# Patient Record
Sex: Male | Born: 1970 | Race: White | Hispanic: No | Smoking: Current every day smoker
Health system: Southern US, Community
[De-identification: ages and names within clinical notes are randomized; demographics above are authoritative.]

## PROBLEM LIST (undated history)

## (undated) DIAGNOSIS — I10 Essential (primary) hypertension: Secondary | ICD-10-CM

## (undated) DIAGNOSIS — IMO0001 Reserved for inherently not codable concepts without codable children: Secondary | ICD-10-CM

## (undated) DIAGNOSIS — M1990 Unspecified osteoarthritis, unspecified site: Secondary | ICD-10-CM

## (undated) HISTORY — PX: BACK SURGERY: SHX140

## (undated) HISTORY — PX: APPENDECTOMY: SHX54

## (undated) HISTORY — PX: ACETABULUM FRACTURE SURGERY: SHX541

## (undated) HISTORY — PX: FEMUR SURGERY: SHX943

---

## 2003-11-12 ENCOUNTER — Emergency Department (HOSPITAL_COMMUNITY): Admission: EM | Admit: 2003-11-12 | Discharge: 2003-11-12 | Payer: Self-pay | Admitting: Emergency Medicine

## 2005-07-04 ENCOUNTER — Emergency Department (HOSPITAL_COMMUNITY): Admission: EM | Admit: 2005-07-04 | Discharge: 2005-07-04 | Payer: Self-pay | Admitting: Emergency Medicine

## 2005-07-06 ENCOUNTER — Ambulatory Visit: Payer: Self-pay | Admitting: Orthopedic Surgery

## 2007-01-29 ENCOUNTER — Emergency Department (HOSPITAL_COMMUNITY): Admission: EM | Admit: 2007-01-29 | Discharge: 2007-01-29 | Payer: Self-pay | Admitting: Emergency Medicine

## 2007-01-31 ENCOUNTER — Ambulatory Visit (HOSPITAL_COMMUNITY): Admission: RE | Admit: 2007-01-31 | Discharge: 2007-02-01 | Payer: Self-pay | Admitting: Family Medicine

## 2007-08-17 ENCOUNTER — Ambulatory Visit (HOSPITAL_COMMUNITY): Admission: RE | Admit: 2007-08-17 | Discharge: 2007-08-18 | Payer: Self-pay | Admitting: Orthopaedic Surgery

## 2008-01-11 ENCOUNTER — Emergency Department (HOSPITAL_COMMUNITY): Admission: EM | Admit: 2008-01-11 | Discharge: 2008-01-11 | Payer: Self-pay | Admitting: Emergency Medicine

## 2008-01-14 ENCOUNTER — Emergency Department (HOSPITAL_COMMUNITY): Admission: EM | Admit: 2008-01-14 | Discharge: 2008-01-14 | Payer: Self-pay | Admitting: Emergency Medicine

## 2009-01-13 ENCOUNTER — Encounter: Payer: Self-pay | Admitting: Emergency Medicine

## 2009-01-13 ENCOUNTER — Inpatient Hospital Stay (HOSPITAL_COMMUNITY): Admission: EM | Admit: 2009-01-13 | Discharge: 2009-01-17 | Payer: Self-pay | Admitting: Orthopaedic Surgery

## 2009-03-11 ENCOUNTER — Encounter (HOSPITAL_COMMUNITY): Admission: RE | Admit: 2009-03-11 | Discharge: 2009-04-10 | Payer: Self-pay | Admitting: Orthopedic Surgery

## 2009-04-11 ENCOUNTER — Encounter (HOSPITAL_COMMUNITY): Admission: RE | Admit: 2009-04-11 | Discharge: 2009-04-19 | Payer: Self-pay | Admitting: Orthopedic Surgery

## 2009-04-23 ENCOUNTER — Encounter (HOSPITAL_COMMUNITY): Admission: RE | Admit: 2009-04-23 | Discharge: 2009-05-23 | Payer: Self-pay | Admitting: Orthopedic Surgery

## 2009-05-08 ENCOUNTER — Encounter (HOSPITAL_COMMUNITY): Admission: RE | Admit: 2009-05-08 | Discharge: 2009-06-07 | Payer: Self-pay | Admitting: Orthopedic Surgery

## 2009-05-24 ENCOUNTER — Encounter (HOSPITAL_COMMUNITY): Admission: RE | Admit: 2009-05-24 | Discharge: 2009-06-23 | Payer: Self-pay | Admitting: Orthopedic Surgery

## 2009-06-29 ENCOUNTER — Observation Stay (HOSPITAL_COMMUNITY): Admission: EM | Admit: 2009-06-29 | Discharge: 2009-06-29 | Payer: Self-pay | Admitting: Emergency Medicine

## 2009-10-01 ENCOUNTER — Encounter: Admission: RE | Admit: 2009-10-01 | Discharge: 2009-10-01 | Payer: Self-pay | Admitting: Orthopedic Surgery

## 2010-07-14 LAB — DIFFERENTIAL
Basophils Absolute: 0.1 10*3/uL (ref 0.0–0.1)
Eosinophils Relative: 2 % (ref 0–5)
Lymphocytes Relative: 19 % (ref 12–46)
Lymphs Abs: 1.5 10*3/uL (ref 0.7–4.0)
Neutro Abs: 5.4 10*3/uL (ref 1.7–7.7)

## 2010-07-14 LAB — CBC
HCT: 44.7 % (ref 39.0–52.0)
MCHC: 35 g/dL (ref 30.0–36.0)
MCV: 90 fL (ref 78.0–100.0)
Platelets: 186 10*3/uL (ref 150–400)
RBC: 4.97 MIL/uL (ref 4.22–5.81)

## 2010-07-14 LAB — URINE CULTURE: Colony Count: NO GROWTH

## 2010-07-14 LAB — COMPREHENSIVE METABOLIC PANEL
AST: 22 U/L (ref 0–37)
BUN: 5 mg/dL — ABNORMAL LOW (ref 6–23)
CO2: 29 mEq/L (ref 19–32)
Calcium: 9.2 mg/dL (ref 8.4–10.5)
Creatinine, Ser: 0.8 mg/dL (ref 0.4–1.5)
GFR calc Af Amer: >60 mL/min (ref >60)
GFR calc non Af Amer: >60 mL/min (ref >60)

## 2010-07-14 LAB — URINE MICROSCOPIC-ADD ON

## 2010-07-14 LAB — URINALYSIS, ROUTINE W REFLEX MICROSCOPIC
Bilirubin Urine: NEGATIVE
Glucose, UA: NEGATIVE mg/dL
Ketones, ur: NEGATIVE mg/dL
pH: 7 (ref 5.0–8.0)

## 2010-07-25 LAB — BASIC METABOLIC PANEL
BUN: 3 mg/dL — ABNORMAL LOW (ref 6–23)
BUN: 3 mg/dL — ABNORMAL LOW (ref 6–23)
BUN: 3 mg/dL — ABNORMAL LOW (ref 6–23)
BUN: 3 mg/dL — ABNORMAL LOW (ref 6–23)
CO2: 29 mEq/L (ref 19–32)
CO2: 29 mEq/L (ref 19–32)
Calcium: 7.8 mg/dL — ABNORMAL LOW (ref 8.4–10.5)
Calcium: 7.9 mg/dL — ABNORMAL LOW (ref 8.4–10.5)
Calcium: 7.9 mg/dL — ABNORMAL LOW (ref 8.4–10.5)
Calcium: 8.6 mg/dL (ref 8.4–10.5)
Chloride: 97 mEq/L (ref 96–112)
Chloride: 102 mEq/L (ref 96–112)
Creatinine, Ser: 0.75 mg/dL (ref 0.4–1.5)
Creatinine, Ser: 0.8 mg/dL (ref 0.4–1.5)
Creatinine, Ser: 0.76 mg/dL (ref 0.4–1.5)
GFR calc Af Amer: >60 mL/min (ref >60)
GFR calc non Af Amer: >60 mL/min (ref >60)
GFR calc non Af Amer: >60 mL/min (ref >60)
GFR calc non Af Amer: >60 mL/min (ref >60)
GFR calc non Af Amer: >60 mL/min (ref >60)
Glucose, Bld: 104 mg/dL — ABNORMAL HIGH (ref 70–99)
Glucose, Bld: 116 mg/dL — ABNORMAL HIGH (ref 70–99)
Glucose, Bld: 149 mg/dL — ABNORMAL HIGH (ref 70–99)
Potassium: 3.8 mEq/L (ref 3.5–5.1)
Sodium: 133 mEq/L — ABNORMAL LOW (ref 135–145)
Sodium: 134 mEq/L — ABNORMAL LOW (ref 135–145)
Sodium: 137 mEq/L (ref 135–145)

## 2010-07-25 LAB — CROSSMATCH

## 2010-07-25 LAB — PROTIME-INR
INR: 1.2 (ref 0.00–1.49)
INR: 1.6 — ABNORMAL HIGH (ref 0.00–1.49)
INR: 1.7 — ABNORMAL HIGH (ref 0.00–1.49)
INR: 1 (ref 0.00–1.49)
Prothrombin Time: 14.6 seconds (ref 11.6–15.2)
Prothrombin Time: 18.9 seconds — ABNORMAL HIGH (ref 11.6–15.2)

## 2010-07-25 LAB — URINALYSIS, ROUTINE W REFLEX MICROSCOPIC
Glucose, UA: NEGATIVE mg/dL
Glucose, UA: NEGATIVE mg/dL
Hgb urine dipstick: NEGATIVE
Ketones, ur: NEGATIVE mg/dL
Nitrite: NEGATIVE
Nitrite: NEGATIVE
Protein, ur: NEGATIVE mg/dL
Specific Gravity, Urine: 1.02 (ref 1.005–1.030)
Specific Gravity, Urine: 1.022 (ref 1.005–1.030)
Specific Gravity, Urine: 1.025 (ref 1.005–1.030)
Urobilinogen, UA: 1 mg/dL (ref 0.0–1.0)
Urobilinogen, UA: 1 mg/dL (ref 0.0–1.0)
pH: 5.5 (ref 5.0–8.0)
pH: 6 (ref 5.0–8.0)

## 2010-07-25 LAB — URINE MICROSCOPIC-ADD ON

## 2010-07-25 LAB — CBC
HCT: 28.6 % — ABNORMAL LOW (ref 39.0–52.0)
Hemoglobin: 10.1 g/dL — ABNORMAL LOW (ref 13.0–17.0)
Hemoglobin: 14.9 g/dL (ref 13.0–17.0)
MCHC: 35.6 g/dL (ref 30.0–36.0)
MCHC: 34.8 g/dL (ref 30.0–36.0)
MCV: 96 fL (ref 78.0–100.0)
MCV: 94.9 fL (ref 78.0–100.0)
Platelets: 136 10*3/uL — ABNORMAL LOW (ref 150–400)
Platelets: 143 10*3/uL — ABNORMAL LOW (ref 150–400)
RBC: 2.98 MIL/uL — ABNORMAL LOW (ref 4.22–5.81)
RBC: 4.51 MIL/uL (ref 4.22–5.81)
RDW: 12.7 % (ref 11.5–15.5)
RDW: 12.9 % (ref 11.5–15.5)
WBC: 8.1 10*3/uL (ref 4.0–10.5)
WBC: 8.9 10*3/uL (ref 4.0–10.5)
WBC: 10.5 10*3/uL (ref 4.0–10.5)

## 2010-07-25 LAB — DIFFERENTIAL
Eosinophils Absolute: 0.1 10*3/uL (ref 0.0–0.7)
Lymphs Abs: 1.8 10*3/uL (ref 0.7–4.0)
Monocytes Absolute: 0.6 10*3/uL (ref 0.1–1.0)
Monocytes Relative: 5 % (ref 3–12)
Neutrophils Relative %: 76 % (ref 43–77)

## 2010-07-25 LAB — APTT: aPTT: 25 seconds (ref 24–37)

## 2010-07-25 LAB — POCT I-STAT 4, (NA,K, GLUC, HGB,HCT)
Glucose, Bld: 136 mg/dL — ABNORMAL HIGH (ref 70–99)
HCT: 34 % — ABNORMAL LOW (ref 39.0–52.0)
Potassium: 4.4 mEq/L (ref 3.5–5.1)

## 2010-07-25 LAB — URINE CULTURE: Culture: NO GROWTH

## 2010-07-25 LAB — ABO/RH: ABO/RH(D): O POS

## 2010-07-25 LAB — HEMOGLOBIN AND HEMATOCRIT, BLOOD: Hemoglobin: 13.1 g/dL (ref 13.0–17.0)
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
## 2010-12-11 ENCOUNTER — Emergency Department (HOSPITAL_COMMUNITY)
Admission: EM | Admit: 2010-12-11 | Discharge: 2010-12-11 | Disposition: A | Discharge status: Home / Self Care | Payer: Self-pay | Attending: Emergency Medicine | Admitting: Emergency Medicine

## 2010-12-11 ENCOUNTER — Encounter: Payer: Self-pay | Admitting: Emergency Medicine

## 2010-12-11 DIAGNOSIS — F17200 Nicotine dependence, unspecified, uncomplicated: Secondary | ICD-10-CM | POA: Insufficient documentation

## 2010-12-11 DIAGNOSIS — H2000 Unspecified acute and subacute iridocyclitis: Secondary | ICD-10-CM | POA: Insufficient documentation

## 2010-12-11 MED ORDER — TETRACAINE HCL 0.5 % OP SOLN
1.0000 [drp] | Freq: Once | OPHTHALMIC | Status: AC
  Administered 2010-12-11: 1 [drp] via OPHTHALMIC

## 2010-12-11 MED ORDER — KETOROLAC TROMETHAMINE 0.5 % OP SOLN
1.0000 [drp] | Freq: Once | OPHTHALMIC | Status: AC
  Administered 2010-12-11: 1 [drp] via OPHTHALMIC
  Filled 2010-12-11: qty 5

## 2010-12-11 MED ORDER — FLUORESCEIN SODIUM 1 MG OP STRP
1.0000 | ORAL_STRIP | Freq: Once | OPHTHALMIC | Status: AC
  Administered 2010-12-11: 09:00:00 via OPHTHALMIC

## 2010-12-11 MED ORDER — ATROPINE SULFATE 1 % OP SOLN
2.0000 [drp] | Freq: Once | OPHTHALMIC | Status: AC
  Administered 2010-12-11: 2 [drp] via OPHTHALMIC
  Filled 2010-12-11: qty 2

## 2010-12-11 NOTE — ED Notes (Signed)
Right Eye- 20/25  Left Eye- 20/40  Both Eyes- 20/30

## 2010-12-11 NOTE — ED Provider Notes (Signed)
History     CSN: [id number] Arrival date & time: 12/11/2010  7:43 AM  Chief Complaint  Patient presents with  . Eye Problem   Patient is a 40 y.o. male presenting with eye problem. The history is provided by the patient.  Eye Problem  The current episode started 2 days ago. The problem occurs constantly. The problem has not changed since onset.There is pain in the left eye. There was no injury mechanism. Associated symptoms include foreign body sensation, photophobia and eye redness. Pertinent negatives include no numbness, no blurred vision, no decreased vision, no discharge, no nausea and no weakness. Treatments tried: He tried an otc stye medicine with no relief.    History reviewed. No pertinent past medical history.  Past Surgical History  Procedure Date  . Acetabulum fracture surgery   . Femur surgery     rod placed  . Back surgery     History reviewed. No pertinent family history.  History  Substance Use Topics  . Smoking status: Current Everyday Smoker -- 2.0 packs/day  . Smokeless tobacco: Not on file  . Alcohol Use: Yes      Review of Systems  Constitutional: Negative for fever.  HENT: Negative for congestion, sore throat, neck pain and ear discharge.   Eyes: Positive for photophobia and redness. Negative for blurred vision, discharge, itching and visual disturbance.  Respiratory: Negative for chest tightness and shortness of breath.   Cardiovascular: Negative for chest pain.  Gastrointestinal: Negative for nausea and abdominal pain.  Genitourinary: Negative.   Musculoskeletal: Negative for joint swelling and arthralgias.  Skin: Negative.  Negative for rash and wound.  Neurological: Negative for dizziness, weakness, light-headedness, numbness and headaches.  Hematological: Negative.   Psychiatric/Behavioral: Negative.     Physical Exam  BP 142/93  Pulse 78  Temp 98.1 F (36.7 C)  Ht 5\' 11"  (1.803 m)  Wt 155 lb (70.308 kg)  BMI 21.62 kg/m2  SpO2  99%  Physical Exam  Nursing note and vitals reviewed. Constitutional: He is oriented to person, place, and time. He appears well-developed and well-nourished.  HENT:  Head: Normocephalic and atraumatic.  Eyes: EOM and lids are normal. Pupils are equal, round, and reactive to light. No foreign bodies found. Right eye exhibits no discharge. Left eye exhibits no chemosis, no discharge and no exudate. No foreign body present in the left eye. Left conjunctiva is not injected.       Visual acuity 20/35 ou,  20/25 os,  20/40 od.    Pressure 6 in left eye.  He does have small raised papule deep mid lower lid bilateral.  Photophobia left eye  with no consensual eye pain.   Neck: Normal range of motion.  Cardiovascular: Normal rate, regular rhythm, normal heart sounds and intact distal pulses.   Pulmonary/Chest: Effort normal and breath sounds normal. He has no wheezes.  Abdominal: Soft. Bowel sounds are normal. There is no tenderness.  Musculoskeletal: Normal range of motion.  Neurological: He is alert and oriented to person, place, and time.  Skin: Skin is warm and dry.  Psychiatric: He has a normal mood and affect.    ED Course  Procedures  MDM Discussed case with Dr. Lita Mains.  Suggests cycloplegic drop for pain relief.  Will see in office if continues to have discomfort.  Can see late this afternoon.      Candis Musa, PA 12/11/10 [phone]

## 2010-12-11 NOTE — ED Notes (Signed)
Pt c/o left eye redness/irritation.

## 2010-12-11 NOTE — ED Notes (Signed)
MD at bedside. 

## 2010-12-15 NOTE — ED Provider Notes (Signed)
Medical screening examination/treatment/procedure(s) were performed by non-physician practitioner and as supervising physician I was immediately available for consultation/collaboration.   Benny Lennert, MD 12/15/10 [phone]

## 2011-01-13 LAB — COMPREHENSIVE METABOLIC PANEL
AST: 19
BUN: 10
CO2: 30
Chloride: 102
Creatinine, Ser: 0.93
GFR calc non Af Amer: >60
Glucose, Bld: 106 — ABNORMAL HIGH
Total Bilirubin: 0.4

## 2011-01-13 LAB — CBC
HCT: 46
Hemoglobin: 15.7
MCV: 93.1
RBC: 4.94
WBC: 9.9

## 2011-07-23 ENCOUNTER — Other Ambulatory Visit: Payer: Self-pay | Admitting: Orthopedic Surgery

## 2011-07-23 DIAGNOSIS — M25559 Pain in unspecified hip: Secondary | ICD-10-CM

## 2011-07-24 ENCOUNTER — Ambulatory Visit
Admission: RE | Admit: 2011-07-24 | Discharge: 2011-07-24 | Disposition: A | Discharge status: Home / Self Care | Payer: Medicare Other | Source: Ambulatory Visit | Attending: Orthopedic Surgery | Admitting: Orthopedic Surgery

## 2011-07-24 DIAGNOSIS — M25559 Pain in unspecified hip: Secondary | ICD-10-CM

## 2011-07-24 MED ORDER — METHYLPREDNISOLONE ACETATE 40 MG/ML INJ SUSP (RADIOLOG
120.0000 mg | Freq: Once | INTRAMUSCULAR | Status: AC
  Administered 2011-07-24: 120 mg via INTRA_ARTICULAR

## 2011-07-24 MED ORDER — IOHEXOL 180 MG/ML  SOLN
1.0000 mL | Freq: Once | INTRAMUSCULAR | Status: AC | PRN
  Administered 2011-07-24: 1 mL via INTRA_ARTICULAR

## 2012-03-01 ENCOUNTER — Other Ambulatory Visit: Payer: Self-pay | Admitting: Orthopedic Surgery

## 2012-03-01 DIAGNOSIS — M1990 Unspecified osteoarthritis, unspecified site: Secondary | ICD-10-CM

## 2012-03-02 ENCOUNTER — Ambulatory Visit
Admission: RE | Admit: 2012-03-02 | Discharge: 2012-03-02 | Disposition: A | Discharge status: Home / Self Care | Payer: Medicare Other | Source: Ambulatory Visit | Attending: Orthopedic Surgery | Admitting: Orthopedic Surgery

## 2012-03-02 DIAGNOSIS — M1990 Unspecified osteoarthritis, unspecified site: Secondary | ICD-10-CM

## 2012-03-02 MED ORDER — METHYLPREDNISOLONE ACETATE 40 MG/ML INJ SUSP (RADIOLOG
120.0000 mg | Freq: Once | INTRAMUSCULAR | Status: AC
  Administered 2012-03-02: 120 mg via INTRA_ARTICULAR

## 2012-03-02 MED ORDER — IOHEXOL 180 MG/ML  SOLN
1.0000 mL | Freq: Once | INTRAMUSCULAR | Status: AC | PRN
  Administered 2012-03-02: 1 mL via INTRA_ARTICULAR

## 2012-05-09 ENCOUNTER — Encounter (HOSPITAL_COMMUNITY): Payer: Self-pay

## 2012-05-09 ENCOUNTER — Emergency Department (HOSPITAL_COMMUNITY)
Admission: EM | Admit: 2012-05-09 | Discharge: 2012-05-09 | Disposition: A | Discharge status: Home / Self Care | Payer: Medicare Other | Attending: Emergency Medicine | Admitting: Emergency Medicine

## 2012-05-09 DIAGNOSIS — F17200 Nicotine dependence, unspecified, uncomplicated: Secondary | ICD-10-CM | POA: Insufficient documentation

## 2012-05-09 DIAGNOSIS — L03211 Cellulitis of face: Secondary | ICD-10-CM | POA: Insufficient documentation

## 2012-05-09 DIAGNOSIS — L0201 Cutaneous abscess of face: Secondary | ICD-10-CM | POA: Insufficient documentation

## 2012-05-09 DIAGNOSIS — R220 Localized swelling, mass and lump, head: Secondary | ICD-10-CM | POA: Insufficient documentation

## 2012-05-09 LAB — CBC WITH DIFFERENTIAL/PLATELET
Basophils Relative: 0 % (ref 0–1)
Hemoglobin: 14.7 g/dL (ref 13.0–17.0)
MCHC: 35.1 g/dL (ref 30.0–36.0)
Monocytes Relative: 7 % (ref 3–12)
Neutro Abs: 5.9 10*3/uL (ref 1.7–7.7)
Neutrophils Relative %: 71 % (ref 43–77)
RBC: 4.5 MIL/uL (ref 4.22–5.81)
WBC: 8.4 10*3/uL (ref 4.0–10.5)

## 2012-05-09 MED ORDER — CEPHALEXIN 500 MG PO CAPS: 500.0000 mg | ORAL_CAPSULE | Freq: Four times a day (QID) | ORAL | Status: DC

## 2012-05-09 MED ORDER — DOXYCYCLINE HYCLATE 100 MG PO CAPS: 100.0000 mg | ORAL_CAPSULE | Freq: Two times a day (BID) | ORAL | Status: DC

## 2012-05-09 MED ORDER — VANCOMYCIN HCL IN DEXTROSE 1-5 GM/200ML-% IV SOLN
1000.0000 mg | Freq: Once | INTRAVENOUS | Status: AC
  Administered 2012-05-09: 1000 mg via INTRAVENOUS
  Filled 2012-05-09: qty 200

## 2012-05-09 NOTE — ED Notes (Signed)
Pt c/o "knot" to forehead for 4days. Pt seen last night at [facility] and treated for "cellutitis" per pt. Pt was given antibiotic but feels like it is getting worse. Red swollen knot to forehead present along with left eye swelling.

## 2012-05-09 NOTE — ED Provider Notes (Signed)
Medical screening examination/treatment/procedure(s) were conducted as a shared visit with non-physician practitioner(s) and myself.  I personally evaluated the patient during the encounter  The patient seen by me. Patient started on antibiotics yesterday for cellulitis early abscess formation of the left fore head area. Those symptoms started to develop about 4 days ago. Patient states that the redness is getting worse. Antibiotic he was started on the Septra. Also got an IV antibiotic at [facility] yesterday. Today we're giving a dose of IV Vanco recommend switching doxycycline if he can afford that and continuing antibiotic treatment. Nothing to I&D at this point in time. Does have some swelling of the upper left eyelid but has no range of motion pain of the eye so no evidence of a orbital cellulitis. Suspect that the swelling of the left upper lid is more just due to the inflammation of the 4 head and not due to any spread of infection into that area.  Shelda Jakes, MD 05/09/12 1030

## 2012-05-09 NOTE — ED Provider Notes (Signed)
History     CSN: [id number]  Arrival date & time 05/09/12  7829   First MD Initiated Contact with Patient 05/09/12 [phone]      Chief Complaint  Patient presents with  . Abscess    (Consider location/radiation/quality/duration/timing/severity/associated sxs/prior treatment) HPI Comments: Patient c/o pain and "red knot" to his forehead for 4 days.  States that he was seen at another ED for same on the night prior to this visit and given a "shot of antibiotic" and rx for Bactrim.  Reports no improvement in his symptoms or pain and now states he noticed swelling to his left forehead and around the left upper eyelid.  He states he is taking the medication as directed.  He denies fever, neck pain or stiffness, or visual changes.  The history is provided by the patient.    History reviewed. No pertinent past medical history.  Past Surgical History  Procedure Date  . Acetabulum fracture surgery   . Femur surgery     rod placed  . Back surgery     No family history on file.  History  Substance Use Topics  . Smoking status: Current Every Day Smoker -- 2.0 packs/day  . Smokeless tobacco: Not on file  . Alcohol Use: Yes      Review of Systems  Constitutional: Negative for fever, chills, activity change and appetite change.  HENT: Positive for facial swelling. Negative for sore throat, trouble swallowing, neck pain and neck stiffness.   Gastrointestinal: Negative for nausea and vomiting.  Musculoskeletal: Negative for joint swelling and arthralgias.  Skin: Positive for color change.       Abscess to forehead  Neurological: Negative for light-headedness, numbness and headaches.  Hematological: Negative for adenopathy.  All other systems reviewed and are negative.    Allergies  Review of patient's allergies indicates no known allergies.  Home Medications   Current Outpatient Rx  Name  Route  Sig  Dispense  Refill  . HYDROCODONE-ACETAMINOPHEN 5-325 MG PO TABS   Oral  Take 1 tablet by mouth Every 6 hours as needed. Pain           BP 133/89  Pulse 85  Temp 98.3 F (36.8 C) (Oral)  Resp 16  Wt 155 lb (70.308 kg)  SpO2 99%  Physical Exam  Nursing note and vitals reviewed. Constitutional: He is oriented to person, place, and time. He appears well-developed and well-nourished. No distress.  HENT:  Head:    Right Ear: External ear normal.  Left Ear: External ear normal.  Mouth/Throat: Oropharynx is clear and moist. No oropharyngeal exudate.       Localized area of erythema and induration to the left forehead. No drainage or fluctuance.  Mild edema along the left upper eyelid.  No periorbital erythema.    Eyes: Conjunctivae normal and EOM are normal. Pupils are equal, round, and reactive to light.  Neck: Normal range of motion. Neck supple. No thyromegaly present.  Cardiovascular: Normal rate, regular rhythm, normal heart sounds and intact distal pulses.   No murmur heard. Pulmonary/Chest: Effort normal and breath sounds normal. No respiratory distress.  Musculoskeletal: He exhibits no tenderness.  Lymphadenopathy:    He has no cervical adenopathy.  Neurological: He is alert and oriented to person, place, and time. He exhibits normal muscle tone. Coordination normal.  Skin: Skin is warm and dry.       See HENT exam    ED Course  Procedures (including critical care time)  Results for orders  placed during the hospital encounter of 05/09/12  CBC WITH DIFFERENTIAL      Component Value Range   WBC 8.4  4.0 - 10.5 K/uL   RBC 4.50  4.22 - 5.81 MIL/uL   Hemoglobin 14.7  13.0 - 17.0 g/dL   HCT 95.6  21.3 - 08.6 %   MCV 93.1  78.0 - 100.0 fL   MCH 32.7  26.0 - 34.0 pg   MCHC 35.1  30.0 - 36.0 g/dL   RDW 57.8  46.9 - 62.9 %   Platelets 237  150 - 400 K/uL   Neutrophils Relative 71  43 - 77 %   Neutro Abs 5.9  1.7 - 7.7 K/uL   Lymphocytes Relative 20  12 - 46 %   Lymphs Abs 1.7  0.7 - 4.0 K/uL   Monocytes Relative 7  3 - 12 %   Monocytes  Absolute 0.6  0.1 - 1.0 K/uL   Eosinophils Relative 2  0 - 5 %   Eosinophils Absolute 0.1  0.0 - 0.7 K/uL   Basophils Relative 0  0 - 1 %   Basophils Absolute 0.0  0.0 - 0.1 K/uL         MDM    Cellulitis and likely early abscess to the left forehead with associated STS to the left upper eyelid.  No erythema or periorbital tenderness to suggest orbital cellulitis.  Pt is otherwise well appearing.     IV vancomycin given in dept.    Patient also seen by EDP and care plan discussed.    Will have pt d/c the Bactrim and start Doxy and keflex, return here in 2-3 days for recheck if the sx's are worsening.  Has pain medication from other ED visit       Burman Bruington L. Bradleigh Sonnen, [place] 05/12/12 1343

## 2012-05-16 NOTE — ED Provider Notes (Signed)
Medical screening examination/treatment/procedure(s) were performed by non-physician practitioner and as supervising physician I was immediately available for consultation/collaboration.   Shelda Jakes, MD 05/16/12 1110

## 2012-05-17 ENCOUNTER — Encounter (HOSPITAL_COMMUNITY): Payer: Self-pay | Admitting: *Deleted

## 2012-05-17 ENCOUNTER — Emergency Department (HOSPITAL_COMMUNITY): Payer: Medicare Other

## 2012-05-17 ENCOUNTER — Emergency Department (HOSPITAL_COMMUNITY)
Admission: EM | Admit: 2012-05-17 | Discharge: 2012-05-17 | Disposition: A | Discharge status: Home / Self Care | Payer: Medicare Other | Attending: Emergency Medicine | Admitting: Emergency Medicine

## 2012-05-17 DIAGNOSIS — F1010 Alcohol abuse, uncomplicated: Secondary | ICD-10-CM | POA: Insufficient documentation

## 2012-05-17 DIAGNOSIS — S0993XA Unspecified injury of face, initial encounter: Secondary | ICD-10-CM | POA: Insufficient documentation

## 2012-05-17 DIAGNOSIS — S4980XA Other specified injuries of shoulder and upper arm, unspecified arm, initial encounter: Secondary | ICD-10-CM | POA: Insufficient documentation

## 2012-05-17 DIAGNOSIS — S2239XA Fracture of one rib, unspecified side, initial encounter for closed fracture: Secondary | ICD-10-CM | POA: Insufficient documentation

## 2012-05-17 DIAGNOSIS — S7290XA Unspecified fracture of unspecified femur, initial encounter for closed fracture: Secondary | ICD-10-CM | POA: Insufficient documentation

## 2012-05-17 DIAGNOSIS — IMO0002 Reserved for concepts with insufficient information to code with codable children: Secondary | ICD-10-CM | POA: Insufficient documentation

## 2012-05-17 DIAGNOSIS — S329XXA Fracture of unspecified parts of lumbosacral spine and pelvis, initial encounter for closed fracture: Secondary | ICD-10-CM | POA: Insufficient documentation

## 2012-05-17 DIAGNOSIS — S46909A Unspecified injury of unspecified muscle, fascia and tendon at shoulder and upper arm level, unspecified arm, initial encounter: Secondary | ICD-10-CM | POA: Insufficient documentation

## 2012-05-17 DIAGNOSIS — S298XXA Other specified injuries of thorax, initial encounter: Secondary | ICD-10-CM | POA: Insufficient documentation

## 2012-05-17 DIAGNOSIS — Y9389 Activity, other specified: Secondary | ICD-10-CM | POA: Insufficient documentation

## 2012-05-17 DIAGNOSIS — F17200 Nicotine dependence, unspecified, uncomplicated: Secondary | ICD-10-CM | POA: Insufficient documentation

## 2012-05-17 DIAGNOSIS — S0990XA Unspecified injury of head, initial encounter: Secondary | ICD-10-CM | POA: Insufficient documentation

## 2012-05-17 DIAGNOSIS — T1490XA Injury, unspecified, initial encounter: Secondary | ICD-10-CM

## 2012-05-17 DIAGNOSIS — Z9889 Other specified postprocedural states: Secondary | ICD-10-CM | POA: Insufficient documentation

## 2012-05-17 DIAGNOSIS — Y929 Unspecified place or not applicable: Secondary | ICD-10-CM | POA: Insufficient documentation

## 2012-05-17 DIAGNOSIS — S3981XA Other specified injuries of abdomen, initial encounter: Secondary | ICD-10-CM | POA: Insufficient documentation

## 2012-05-17 LAB — CBC WITH DIFFERENTIAL/PLATELET
Basophils Relative: 0 % (ref 0–1)
Eosinophils Absolute: 0.1 10*3/uL (ref 0.0–0.7)
HCT: 39.9 % (ref 39.0–52.0)
Hemoglobin: 14.2 g/dL (ref 13.0–17.0)
Lymphs Abs: 2.6 10*3/uL (ref 0.7–4.0)
MCH: 32.8 pg (ref 26.0–34.0)
MCHC: 35.6 g/dL (ref 30.0–36.0)
Monocytes Absolute: 0.6 10*3/uL (ref 0.1–1.0)
Monocytes Relative: 6 % (ref 3–12)
Neutro Abs: 7 10*3/uL (ref 1.7–7.7)
Neutrophils Relative %: 68 % (ref 43–77)
RBC: 4.33 MIL/uL (ref 4.22–5.81)

## 2012-05-17 LAB — BASIC METABOLIC PANEL WITH GFR
BUN: 9 mg/dL (ref 6–23)
CO2: 25 meq/L (ref 19–32)
Calcium: 9.1 mg/dL (ref 8.4–10.5)
Chloride: 98 meq/L (ref 96–112)
Creatinine, Ser: 0.65 mg/dL (ref 0.50–1.35)
GFR calc Af Amer: >90 mL/min
GFR calc non Af Amer: >90 mL/min
Glucose, Bld: 87 mg/dL (ref 70–99)
Potassium: 3.4 meq/L — ABNORMAL LOW (ref 3.5–5.1)
Sodium: 137 meq/L (ref 135–145)

## 2012-05-17 LAB — ETHANOL: Alcohol, Ethyl (B): 208 mg/dL — ABNORMAL HIGH (ref 0–11)

## 2012-05-17 MED ORDER — TRAMADOL HCL 50 MG PO TABS: 50.0000 mg | ORAL_TABLET | Freq: Four times a day (QID) | ORAL | Status: DC | PRN

## 2012-05-17 MED ORDER — IOHEXOL 300 MG/ML  SOLN
100.0000 mL | Freq: Once | INTRAMUSCULAR | Status: AC | PRN
  Administered 2012-05-17: 100 mL via INTRAVENOUS

## 2012-05-17 MED ORDER — SODIUM CHLORIDE 0.9 % IV SOLN
Freq: Once | INTRAVENOUS | Status: AC
  Administered 2012-05-17: 21:00:00 via INTRAVENOUS
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
## 2012-05-17 NOTE — ED Notes (Signed)
Patient stood up beside bed to use urinal; patient denies dizziness.  Continues to c/o pain to left sided rib area.

## 2012-05-17 NOTE — ED Notes (Signed)
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
## 2012-05-17 NOTE — ED Notes (Signed)
Discharge instructions given and reviewed with patient.  Prescription given for Ultram; effects and use explained.  Patient verbalized understanding to take medication as directed.  Patient discharged home in good condition in care of wife.
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
## 2012-05-19 ENCOUNTER — Encounter (HOSPITAL_COMMUNITY): Payer: Self-pay

## 2012-05-19 ENCOUNTER — Emergency Department (HOSPITAL_COMMUNITY): Payer: Medicare Other

## 2012-05-19 ENCOUNTER — Observation Stay (HOSPITAL_COMMUNITY)
Admission: EM | Admit: 2012-05-19 | Discharge: 2012-05-21 | Disposition: A | Discharge status: Home / Self Care | Payer: Medicare Other | Attending: General Surgery | Admitting: General Surgery

## 2012-05-19 DIAGNOSIS — J3489 Other specified disorders of nose and nasal sinuses: Secondary | ICD-10-CM | POA: Insufficient documentation

## 2012-05-19 DIAGNOSIS — R059 Cough, unspecified: Secondary | ICD-10-CM | POA: Insufficient documentation

## 2012-05-19 DIAGNOSIS — S2249XA Multiple fractures of ribs, unspecified side, initial encounter for closed fracture: Principal | ICD-10-CM | POA: Insufficient documentation

## 2012-05-19 DIAGNOSIS — R05 Cough: Secondary | ICD-10-CM | POA: Insufficient documentation

## 2012-05-19 DIAGNOSIS — J939 Pneumothorax, unspecified: Secondary | ICD-10-CM

## 2012-05-19 LAB — CBC WITH DIFFERENTIAL/PLATELET
Basophils Relative: 0 % (ref 0–1)
Eosinophils Absolute: 0.2 10*3/uL (ref 0.0–0.7)
Eosinophils Relative: 2 % (ref 0–5)
HCT: 46.3 % (ref 39.0–52.0)
Hemoglobin: 15.8 g/dL (ref 13.0–17.0)
MCH: 31.9 pg (ref 26.0–34.0)
MCHC: 34.1 g/dL (ref 30.0–36.0)
MCV: 93.3 fL (ref 78.0–100.0)
Monocytes Absolute: 0.7 10*3/uL (ref 0.1–1.0)
Monocytes Relative: 8 % (ref 3–12)
Neutro Abs: 5.3 10*3/uL (ref 1.7–7.7)
RDW: 12.8 % (ref 11.5–15.5)

## 2012-05-19 LAB — BASIC METABOLIC PANEL
BUN: 8 mg/dL (ref 6–23)
Chloride: 97 mEq/L (ref 96–112)
Creatinine, Ser: 0.74 mg/dL (ref 0.50–1.35)
Glucose, Bld: 132 mg/dL — ABNORMAL HIGH (ref 70–99)
Potassium: 3.6 mEq/L (ref 3.5–5.1)

## 2012-05-19 MED ORDER — DIPHENHYDRAMINE HCL 12.5 MG/5ML PO ELIX: 12.5000 mg | ORAL_SOLUTION | Freq: Four times a day (QID) | ORAL | Status: DC | PRN

## 2012-05-19 MED ORDER — ALBUTEROL SULFATE (5 MG/ML) 0.5% IN NEBU
5.0000 mg | INHALATION_SOLUTION | Freq: Once | RESPIRATORY_TRACT | Status: AC
  Administered 2012-05-19: 5 mg via RESPIRATORY_TRACT
  Filled 2012-05-19: qty 1

## 2012-05-19 MED ORDER — DOXYCYCLINE HYCLATE 100 MG PO TABS
100.0000 mg | ORAL_TABLET | Freq: Two times a day (BID) | ORAL | Status: DC
  Administered 2012-05-19 – 2012-05-21 (×4): 100 mg via ORAL
  Filled 2012-05-19 (×4): qty 1

## 2012-05-19 MED ORDER — HYDROMORPHONE HCL PF 2 MG/ML IJ SOLN
2.0000 mg | Freq: Once | INTRAMUSCULAR | Status: AC
  Administered 2012-05-19: 2 mg via INTRAMUSCULAR
  Filled 2012-05-19: qty 1

## 2012-05-19 MED ORDER — FENTANYL CITRATE 0.05 MG/ML IJ SOLN
50.0000 ug | INTRAMUSCULAR | Status: DC | PRN
  Administered 2012-05-19 – 2012-05-21 (×11): 50 ug via INTRAVENOUS
  Filled 2012-05-19 (×11): qty 2

## 2012-05-19 MED ORDER — LORAZEPAM 2 MG/ML IJ SOLN: 1.0000 mg | INTRAMUSCULAR | Status: DC | PRN

## 2012-05-19 MED ORDER — ENOXAPARIN SODIUM 40 MG/0.4ML ~~LOC~~ SOLN
40.0000 mg | SUBCUTANEOUS | Status: DC
  Administered 2012-05-19 – 2012-05-20 (×2): 40 mg via SUBCUTANEOUS
  Filled 2012-05-19 (×3): qty 0.4

## 2012-05-19 MED ORDER — ONDANSETRON HCL 4 MG/2ML IJ SOLN: 4.0000 mg | Freq: Four times a day (QID) | INTRAMUSCULAR | Status: DC | PRN

## 2012-05-19 MED ORDER — LACTATED RINGERS IV SOLN
INTRAVENOUS | Status: DC
  Administered 2012-05-19: 20 mL/h via INTRAVENOUS

## 2012-05-19 MED ORDER — ACETAMINOPHEN 325 MG PO TABS: 650.0000 mg | ORAL_TABLET | Freq: Four times a day (QID) | ORAL | Status: DC | PRN

## 2012-05-19 MED ORDER — IPRATROPIUM BROMIDE 0.02 % IN SOLN
0.5000 mg | Freq: Four times a day (QID) | RESPIRATORY_TRACT | Status: DC
  Administered 2012-05-19 – 2012-05-21 (×7): 0.5 mg via RESPIRATORY_TRACT
  Filled 2012-05-19 (×7): qty 2.5

## 2012-05-19 MED ORDER — ACETAMINOPHEN 650 MG RE SUPP: 650.0000 mg | Freq: Four times a day (QID) | RECTAL | Status: DC | PRN

## 2012-05-19 MED ORDER — DIPHENHYDRAMINE HCL 50 MG/ML IJ SOLN: 12.5000 mg | Freq: Four times a day (QID) | INTRAMUSCULAR | Status: DC | PRN

## 2012-05-19 MED ORDER — NICOTINE 21 MG/24HR TD PT24
21.0000 mg | MEDICATED_PATCH | Freq: Every day | TRANSDERMAL | Status: DC
  Administered 2012-05-19: 21 mg via TRANSDERMAL
  Filled 2012-05-19 (×3): qty 1

## 2012-05-19 NOTE — Plan of Care (Signed)
Problem: Phase I Progression Outcomes Goal: Initial discharge plan identified Outcome: Completed/Met Date Met:  05/19/12 Home with wife at discharge.

## 2012-05-19 NOTE — ED Notes (Signed)
Pt also co dark urine, unable to provide sample at this time. EDP aware.
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
## 2012-05-20 ENCOUNTER — Observation Stay (HOSPITAL_COMMUNITY): Payer: Medicare Other

## 2012-05-20 NOTE — Progress Notes (Signed)
Pt expressed concern about tea colored urine, states he also showed Dr. Lovell Sheehan. Pt urinated approximately 275cc.  Pt has been drinking tea and soda consistently today. I provided him with a large pitcher of water and encouraged him to try this to see if it could clear up his urine some. Sheryn Bison
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
## 2012-05-20 NOTE — Progress Notes (Signed)
UR Chart Review Completed  

## 2012-05-20 NOTE — Care Management Note (Signed)
    Page 1 of 1   05/20/2012     3:03:06 PM   CARE MANAGEMENT NOTE 05/20/2012  Patient:  Joel Salazar, Joel Salazar   Account Number:  [id number]  Date Initiated:  05/20/2012  Documentation initiated by:  Sharrie Rothman  Subjective/Objective Assessment:   Pt admitted from home with pneumothorax. Pt lives with his wife and will return home at discharge. Pt is indepdent with ADL's.     Action/Plan:   No CM or HH needs noted.   Anticipated DC Date:  05/21/2012   Anticipated DC Plan:  HOME/SELF CARE      DC Planning Services  CM consult      Choice offered to / List presented to:             Status of service:  Completed, signed off Medicare Important Message given?   (If response is "NO", the following Medicare IM given date fields will be blank) Date Medicare IM given:   Date Additional Medicare IM given:    Discharge Disposition:  HOME/SELF CARE  Per UR Regulation:    If discussed at Long Length of Stay Meetings, dates discussed:    Comments:  05/20/12 1503 Gurdeep Keesey,RN BSN CM

## 2012-05-21 MED ORDER — OXYCODONE-ACETAMINOPHEN 7.5-325 MG PO TABS: 1.0000 | ORAL_TABLET | ORAL | Status: AC | PRN
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
## 2012-05-21 NOTE — Progress Notes (Signed)
Pt discharged from the floor via ambulation per his request with instructions and prescriptions.  Pt verbalized understanding.  He was in stable condition.

## 2012-06-16 ENCOUNTER — Other Ambulatory Visit: Payer: Self-pay | Admitting: Orthopedic Surgery

## 2012-06-16 DIAGNOSIS — M25552 Pain in left hip: Secondary | ICD-10-CM

## 2012-06-20 ENCOUNTER — Ambulatory Visit
Admission: RE | Admit: 2012-06-20 | Discharge: 2012-06-20 | Disposition: A | Discharge status: Home / Self Care | Payer: Medicare Other | Source: Ambulatory Visit | Attending: Orthopedic Surgery | Admitting: Orthopedic Surgery

## 2012-06-20 DIAGNOSIS — M25552 Pain in left hip: Secondary | ICD-10-CM

## 2012-06-20 MED ORDER — METHYLPREDNISOLONE ACETATE 40 MG/ML INJ SUSP (RADIOLOG
120.0000 mg | Freq: Once | INTRAMUSCULAR | Status: AC
  Administered 2012-06-20: 120 mg via INTRA_ARTICULAR

## 2012-06-20 MED ORDER — IOHEXOL 180 MG/ML  SOLN
1.0000 mL | Freq: Once | INTRAMUSCULAR | Status: AC | PRN
  Administered 2012-06-20: 1 mL via INTRA_ARTICULAR

## 2016-01-10 ENCOUNTER — Emergency Department (HOSPITAL_COMMUNITY)
Admission: EM | Admit: 2016-01-10 | Discharge: 2016-01-11 | Disposition: A | Discharge status: Home / Self Care | Payer: Medicare HMO | Source: Home / Self Care | Attending: Emergency Medicine | Admitting: Emergency Medicine

## 2016-01-10 ENCOUNTER — Encounter (HOSPITAL_COMMUNITY): Payer: Self-pay

## 2016-01-10 DIAGNOSIS — S42021A Displaced fracture of shaft of right clavicle, initial encounter for closed fracture: Secondary | ICD-10-CM | POA: Insufficient documentation

## 2016-01-10 DIAGNOSIS — F17200 Nicotine dependence, unspecified, uncomplicated: Secondary | ICD-10-CM | POA: Insufficient documentation

## 2016-01-10 DIAGNOSIS — S4991XA Unspecified injury of right shoulder and upper arm, initial encounter: Secondary | ICD-10-CM | POA: Diagnosis present

## 2016-01-10 DIAGNOSIS — S2241XA Multiple fractures of ribs, right side, initial encounter for closed fracture: Secondary | ICD-10-CM | POA: Insufficient documentation

## 2016-01-10 DIAGNOSIS — S2231XD Fracture of one rib, right side, subsequent encounter for fracture with routine healing: Secondary | ICD-10-CM | POA: Diagnosis not present

## 2016-01-10 DIAGNOSIS — Y939 Activity, unspecified: Secondary | ICD-10-CM | POA: Insufficient documentation

## 2016-01-10 DIAGNOSIS — Y999 Unspecified external cause status: Secondary | ICD-10-CM

## 2016-01-10 DIAGNOSIS — S42001A Fracture of unspecified part of right clavicle, initial encounter for closed fracture: Secondary | ICD-10-CM | POA: Diagnosis not present

## 2016-01-10 DIAGNOSIS — Y92410 Unspecified street and highway as the place of occurrence of the external cause: Secondary | ICD-10-CM

## 2016-01-10 MED ORDER — FENTANYL CITRATE (PF) 100 MCG/2ML IJ SOLN
50.0000 ug | Freq: Once | INTRAMUSCULAR | Status: AC
  Administered 2016-01-10: 50 ug via INTRAVENOUS
  Filled 2016-01-10: qty 2
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
## 2016-01-11 ENCOUNTER — Emergency Department (HOSPITAL_COMMUNITY): Payer: Medicare HMO

## 2016-01-11 ENCOUNTER — Emergency Department (HOSPITAL_COMMUNITY)
Admission: EM | Admit: 2016-01-11 | Discharge: 2016-01-11 | Disposition: A | Discharge status: Home / Self Care | Payer: Medicare HMO | Attending: Emergency Medicine | Admitting: Emergency Medicine

## 2016-01-11 ENCOUNTER — Encounter (HOSPITAL_COMMUNITY): Payer: Self-pay

## 2016-01-11 DIAGNOSIS — S2231XD Fracture of one rib, right side, subsequent encounter for fracture with routine healing: Secondary | ICD-10-CM | POA: Insufficient documentation

## 2016-01-11 DIAGNOSIS — F17200 Nicotine dependence, unspecified, uncomplicated: Secondary | ICD-10-CM | POA: Insufficient documentation

## 2016-01-11 DIAGNOSIS — S42001A Fracture of unspecified part of right clavicle, initial encounter for closed fracture: Secondary | ICD-10-CM | POA: Insufficient documentation

## 2016-01-11 DIAGNOSIS — Y92410 Unspecified street and highway as the place of occurrence of the external cause: Secondary | ICD-10-CM | POA: Insufficient documentation

## 2016-01-11 DIAGNOSIS — Y999 Unspecified external cause status: Secondary | ICD-10-CM | POA: Insufficient documentation

## 2016-01-11 DIAGNOSIS — Y939 Activity, unspecified: Secondary | ICD-10-CM | POA: Insufficient documentation

## 2016-01-11 DIAGNOSIS — J40 Bronchitis, not specified as acute or chronic: Secondary | ICD-10-CM

## 2016-01-11 MED ORDER — KETOROLAC TROMETHAMINE 30 MG/ML IJ SOLN
30.0000 mg | Freq: Once | INTRAMUSCULAR | Status: AC
  Administered 2016-01-11: 30 mg via INTRAVENOUS
  Filled 2016-01-11: qty 1

## 2016-01-11 MED ORDER — OXYCODONE-ACETAMINOPHEN 5-325 MG PO TABS: 1.0000 | ORAL_TABLET | ORAL | 0 refills | Status: DC | PRN

## 2016-01-11 MED ORDER — PREDNISONE 20 MG PO TABS
40.0000 mg | ORAL_TABLET | Freq: Every day | ORAL | 0 refills | Status: DC
Start: 2016-01-11 — End: 2019-06-22

## 2016-01-11 MED ORDER — ALBUTEROL SULFATE HFA 108 (90 BASE) MCG/ACT IN AERS
2.0000 | INHALATION_SPRAY | Freq: Once | RESPIRATORY_TRACT | Status: AC
  Administered 2016-01-11: 2 via RESPIRATORY_TRACT
  Filled 2016-01-11: qty 6.7

## 2016-01-11 MED ORDER — MORPHINE SULFATE (PF) 4 MG/ML IV SOLN
4.0000 mg | Freq: Once | INTRAVENOUS | Status: AC
  Administered 2016-01-11: 4 mg via INTRAVENOUS
  Filled 2016-01-11: qty 1

## 2016-01-11 MED ORDER — HYDROMORPHONE HCL 1 MG/ML IJ SOLN
1.0000 mg | Freq: Once | INTRAMUSCULAR | Status: AC
  Administered 2016-01-11: 1 mg via INTRAMUSCULAR
  Filled 2016-01-11: qty 1
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
## 2016-01-11 NOTE — Discharge Instructions (Signed)
Continue pain medications. Ice and avoid moving your arm. Take inhaler 2 puffs every 2-4 hrs for cough and SOB. Prednisone as prescribed until all gone for sob and wheezing. Follow up as instructed by Dr. Ave Filterhandler. You should receive a call from him on Monday about surgery instructions.
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
## 2016-01-11 NOTE — Progress Notes (Signed)
Orthopedic Tech Progress Note Patient Details:  Joel DriverBilly R Salazar 13-May-1970 [id number]  Ortho Devices Type of Ortho Device: Sling immobilizer Ortho Device/Splint Interventions: Application   Saul FordyceJennifer C Carmelina Balducci 01/11/2016, 2:02 PM

## 2016-01-11 NOTE — ED Notes (Signed)
Declined W/C at D/C and was escorted to lobby by RN. 
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
## 2016-01-13 ENCOUNTER — Other Ambulatory Visit: Payer: Self-pay | Admitting: Orthopedic Surgery

## 2016-01-13 ENCOUNTER — Encounter (HOSPITAL_COMMUNITY): Payer: Self-pay | Admitting: *Deleted

## 2016-01-14 ENCOUNTER — Ambulatory Visit (HOSPITAL_COMMUNITY): Payer: Medicare HMO

## 2016-01-14 ENCOUNTER — Encounter (HOSPITAL_COMMUNITY)
Admission: RE | Disposition: A | Discharge status: Home / Self Care | Payer: Self-pay | Source: Ambulatory Visit | Attending: Orthopedic Surgery

## 2016-01-14 ENCOUNTER — Ambulatory Visit (HOSPITAL_COMMUNITY): Payer: Medicare HMO | Admitting: Certified Registered Nurse Anesthetist

## 2016-01-14 ENCOUNTER — Ambulatory Visit (HOSPITAL_COMMUNITY)
Admission: RE | Admit: 2016-01-14 | Discharge: 2016-01-14 | Disposition: A | Discharge status: Home / Self Care | Payer: Medicare HMO | Source: Ambulatory Visit | Attending: Orthopedic Surgery | Admitting: Orthopedic Surgery

## 2016-01-14 ENCOUNTER — Encounter (HOSPITAL_COMMUNITY): Payer: Self-pay | Admitting: Surgery

## 2016-01-14 DIAGNOSIS — Y939 Activity, unspecified: Secondary | ICD-10-CM | POA: Diagnosis not present

## 2016-01-14 DIAGNOSIS — F1290 Cannabis use, unspecified, uncomplicated: Secondary | ICD-10-CM | POA: Diagnosis not present

## 2016-01-14 DIAGNOSIS — S42021A Displaced fracture of shaft of right clavicle, initial encounter for closed fracture: Secondary | ICD-10-CM | POA: Diagnosis not present

## 2016-01-14 DIAGNOSIS — R0600 Dyspnea, unspecified: Secondary | ICD-10-CM | POA: Insufficient documentation

## 2016-01-14 DIAGNOSIS — S42001A Fracture of unspecified part of right clavicle, initial encounter for closed fracture: Secondary | ICD-10-CM | POA: Diagnosis present

## 2016-01-14 DIAGNOSIS — M1990 Unspecified osteoarthritis, unspecified site: Secondary | ICD-10-CM | POA: Insufficient documentation

## 2016-01-14 DIAGNOSIS — Z79899 Other long term (current) drug therapy: Secondary | ICD-10-CM | POA: Diagnosis not present

## 2016-01-14 DIAGNOSIS — F17200 Nicotine dependence, unspecified, uncomplicated: Secondary | ICD-10-CM | POA: Diagnosis not present

## 2016-01-14 DIAGNOSIS — Z419 Encounter for procedure for purposes other than remedying health state, unspecified: Secondary | ICD-10-CM

## 2016-01-14 HISTORY — DX: Reserved for inherently not codable concepts without codable children: IMO0001

## 2016-01-14 HISTORY — PX: ORIF CLAVICULAR FRACTURE: SHX5055

## 2016-01-14 HISTORY — DX: Unspecified osteoarthritis, unspecified site: M19.90

## 2016-01-14 LAB — CBC
HCT: 43.2 % (ref 39.0–52.0)
Hemoglobin: 14.6 g/dL (ref 13.0–17.0)
MCH: 33.1 pg (ref 26.0–34.0)
MCHC: 33.8 g/dL (ref 30.0–36.0)
MCV: 98 fL (ref 78.0–100.0)
PLATELETS: 225 10*3/uL (ref 150–400)
RBC: 4.41 MIL/uL (ref 4.22–5.81)
RDW: 12.5 % (ref 11.5–15.5)
WBC: 12.3 10*3/uL — AB (ref 4.0–10.5)

## 2016-01-14 SURGERY — OPEN REDUCTION INTERNAL FIXATION (ORIF) CLAVICULAR FRACTURE
Anesthesia: General | Laterality: Right

## 2016-01-14 MED ORDER — ROCURONIUM BROMIDE 10 MG/ML (PF) SYRINGE
PREFILLED_SYRINGE | INTRAVENOUS | Status: AC
  Filled 2016-01-14: qty 10

## 2016-01-14 MED ORDER — PROPOFOL 10 MG/ML IV BOLUS
INTRAVENOUS | Status: DC | PRN
  Administered 2016-01-14: 200 mg via INTRAVENOUS

## 2016-01-14 MED ORDER — CEFAZOLIN SODIUM-DEXTROSE 2-4 GM/100ML-% IV SOLN
2.0000 g | INTRAVENOUS | Status: AC
  Administered 2016-01-14: 2 g via INTRAVENOUS

## 2016-01-14 MED ORDER — OXYCODONE-ACETAMINOPHEN 5-325 MG PO TABS
2.0000 | ORAL_TABLET | ORAL | Status: DC | PRN
  Administered 2016-01-14: 2 via ORAL

## 2016-01-14 MED ORDER — EPHEDRINE 5 MG/ML INJ
INTRAVENOUS | Status: AC
  Filled 2016-01-14: qty 10

## 2016-01-14 MED ORDER — GLYCOPYRROLATE 0.2 MG/ML IV SOSY
PREFILLED_SYRINGE | INTRAVENOUS | Status: AC
  Filled 2016-01-14: qty 3

## 2016-01-14 MED ORDER — LIDOCAINE 2% (20 MG/ML) 5 ML SYRINGE
INTRAMUSCULAR | Status: AC
  Filled 2016-01-14: qty 10

## 2016-01-14 MED ORDER — SODIUM CHLORIDE 0.9 % IR SOLN
Status: DC | PRN
  Administered 2016-01-14: 1000 mL

## 2016-01-14 MED ORDER — MIDAZOLAM HCL 2 MG/2ML IJ SOLN
INTRAMUSCULAR | Status: DC | PRN
  Administered 2016-01-14: 2 mg via INTRAVENOUS

## 2016-01-14 MED ORDER — LIDOCAINE 2% (20 MG/ML) 5 ML SYRINGE
INTRAMUSCULAR | Status: AC
  Filled 2016-01-14: qty 5

## 2016-01-14 MED ORDER — SUGAMMADEX SODIUM 200 MG/2ML IV SOLN
INTRAVENOUS | Status: AC
  Filled 2016-01-14: qty 2

## 2016-01-14 MED ORDER — LACTATED RINGERS IV SOLN
INTRAVENOUS | Status: DC
  Administered 2016-01-14: 13:00:00 via INTRAVENOUS

## 2016-01-14 MED ORDER — OXYCODONE-ACETAMINOPHEN 5-325 MG PO TABS
ORAL_TABLET | ORAL | Status: AC
  Filled 2016-01-14: qty 2

## 2016-01-14 MED ORDER — ROCURONIUM BROMIDE 100 MG/10ML IV SOLN
INTRAVENOUS | Status: DC | PRN
  Administered 2016-01-14: 200 mg via INTRAVENOUS
  Administered 2016-01-14: 30 mg via INTRAVENOUS

## 2016-01-14 MED ORDER — ONDANSETRON HCL 4 MG/2ML IJ SOLN
INTRAMUSCULAR | Status: AC
  Filled 2016-01-14: qty 2

## 2016-01-14 MED ORDER — LACTATED RINGERS IV SOLN
INTRAVENOUS | Status: DC | PRN
  Administered 2016-01-14 (×2): via INTRAVENOUS

## 2016-01-14 MED ORDER — SUCCINYLCHOLINE CHLORIDE 200 MG/10ML IV SOSY
PREFILLED_SYRINGE | INTRAVENOUS | Status: AC
  Filled 2016-01-14: qty 20

## 2016-01-14 MED ORDER — CEFAZOLIN SODIUM-DEXTROSE 2-4 GM/100ML-% IV SOLN
INTRAVENOUS | Status: AC
  Filled 2016-01-14: qty 100

## 2016-01-14 MED ORDER — PHENYLEPHRINE HCL 10 MG/ML IJ SOLN
INTRAVENOUS | Status: DC | PRN
  Administered 2016-01-14: 20 ug/min via INTRAVENOUS

## 2016-01-14 MED ORDER — SUGAMMADEX SODIUM 200 MG/2ML IV SOLN
INTRAVENOUS | Status: DC | PRN
  Administered 2016-01-14: 131.6 mg via INTRAVENOUS

## 2016-01-14 MED ORDER — FENTANYL CITRATE (PF) 100 MCG/2ML IJ SOLN
INTRAMUSCULAR | Status: AC
Start: 2016-01-14 — End: 2016-01-14
  Filled 2016-01-14: qty 2

## 2016-01-14 MED ORDER — FENTANYL CITRATE (PF) 100 MCG/2ML IJ SOLN
INTRAMUSCULAR | Status: AC
  Filled 2016-01-14: qty 2

## 2016-01-14 MED ORDER — PROPOFOL 10 MG/ML IV BOLUS
INTRAVENOUS | Status: AC
  Filled 2016-01-14: qty 40

## 2016-01-14 MED ORDER — MIDAZOLAM HCL 2 MG/2ML IJ SOLN
INTRAMUSCULAR | Status: AC
  Filled 2016-01-14: qty 2

## 2016-01-14 MED ORDER — LIDOCAINE HCL (CARDIAC) 20 MG/ML IV SOLN
INTRAVENOUS | Status: DC | PRN
  Administered 2016-01-14: 25 mg via INTRAVENOUS

## 2016-01-14 MED ORDER — BUPIVACAINE-EPINEPHRINE (PF) 0.25% -1:200000 IJ SOLN
INTRAMUSCULAR | Status: AC
  Filled 2016-01-14: qty 30

## 2016-01-14 MED ORDER — POVIDONE-IODINE 7.5 % EX SOLN
Freq: Once | CUTANEOUS | Status: DC
  Filled 2016-01-14: qty 118

## 2016-01-14 MED ORDER — OXYCODONE-ACETAMINOPHEN 5-325 MG PO TABS: 1.0000 | ORAL_TABLET | ORAL | 0 refills | Status: DC | PRN

## 2016-01-14 MED ORDER — FENTANYL CITRATE (PF) 100 MCG/2ML IJ SOLN
INTRAMUSCULAR | Status: AC
  Administered 2016-01-14: 100 ug
  Filled 2016-01-14: qty 2

## 2016-01-14 MED ORDER — MIDAZOLAM HCL 2 MG/2ML IJ SOLN
INTRAMUSCULAR | Status: AC
  Administered 2016-01-14: 1 mg
  Filled 2016-01-14: qty 2

## 2016-01-14 MED ORDER — FENTANYL CITRATE (PF) 100 MCG/2ML IJ SOLN
INTRAMUSCULAR | Status: DC | PRN
  Administered 2016-01-14 (×3): 100 ug via INTRAVENOUS

## 2016-01-14 MED ORDER — ONDANSETRON HCL 4 MG/2ML IJ SOLN
INTRAMUSCULAR | Status: DC | PRN
  Administered 2016-01-14: 4 mg via INTRAVENOUS

## 2016-01-14 SURGICAL SUPPLY — 58 items
BIT DRILL 2.8 QUICK RELEASE (BIT) ×1 IMPLANT
CHLORAPREP W/TINT 26ML (MISCELLANEOUS) ×3 IMPLANT
CLOSURE WOUND 1/2 X4 (GAUZE/BANDAGES/DRESSINGS) ×1
COVER SURGICAL LIGHT HANDLE (MISCELLANEOUS) ×3 IMPLANT
DRAPE C-ARM 42X72 X-RAY (DRAPES) ×3 IMPLANT
DRAPE IMP U-DRAPE 54X76 (DRAPES) ×3 IMPLANT
DRAPE INCISE IOBAN 66X45 STRL (DRAPES) ×3 IMPLANT
DRAPE U-SHAPE 47X51 STRL (DRAPES) ×3 IMPLANT
DRILL 2.8 QUICK RELEASE (BIT) ×3
DRSG MEPILEX BORDER 4X8 (GAUZE/BANDAGES/DRESSINGS) ×3 IMPLANT
ELECT REM PT RETURN 9FT ADLT (ELECTROSURGICAL) ×3
ELECTRODE REM PT RTRN 9FT ADLT (ELECTROSURGICAL) ×1 IMPLANT
GLOVE BIO SURGEON STRL SZ7 (GLOVE) ×3 IMPLANT
GLOVE BIO SURGEON STRL SZ7.5 (GLOVE) ×3 IMPLANT
GLOVE BIOGEL PI IND STRL 7.0 (GLOVE) ×1 IMPLANT
GLOVE BIOGEL PI IND STRL 8 (GLOVE) ×1 IMPLANT
GLOVE BIOGEL PI INDICATOR 7.0 (GLOVE) ×2
GLOVE BIOGEL PI INDICATOR 8 (GLOVE) ×2
GOWN STRL REUS W/ TWL LRG LVL3 (GOWN DISPOSABLE) ×3 IMPLANT
GOWN STRL REUS W/ TWL XL LVL3 (GOWN DISPOSABLE) ×1 IMPLANT
GOWN STRL REUS W/TWL LRG LVL3 (GOWN DISPOSABLE) ×6
GOWN STRL REUS W/TWL XL LVL3 (GOWN DISPOSABLE) ×2
KIT BASIN OR (CUSTOM PROCEDURE TRAY) ×3 IMPLANT
KIT BIO-TENODESIS 3X8 DISP (MISCELLANEOUS)
KIT INSRT BABSR STRL DISP BTN (MISCELLANEOUS) IMPLANT
KIT ROOM TURNOVER OR (KITS) ×3 IMPLANT
MANIFOLD NEPTUNE WASTE (CANNULA) ×3 IMPLANT
NEEDLE HYPO 25GX1X1/2 BEV (NEEDLE) ×3 IMPLANT
NEEDLE SPNL 18GX3.5 QUINCKE PK (NEEDLE) ×3 IMPLANT
NS IRRIG 1000ML POUR BTL (IV SOLUTION) ×3 IMPLANT
PACK SHOULDER (CUSTOM PROCEDURE TRAY) ×3 IMPLANT
PACK UNIVERSAL I (CUSTOM PROCEDURE TRAY) ×3 IMPLANT
PAD ARMBOARD 7.5X6 YLW CONV (MISCELLANEOUS) ×6 IMPLANT
PLATE LOCKING CLAVICLE 10 HOLE (Plate) ×3 IMPLANT
RETRIEVER SUT HEWSON (MISCELLANEOUS) IMPLANT
SCREW HEXALOBE LOCKING 3.5X14M (Screw) ×3 IMPLANT
SCREW HEXALOBE NON-LOCK 3.5X14 (Screw) ×6 IMPLANT
SCREW HEXALOBE NON-LOCK 3.5X16 (Screw) ×6 IMPLANT
SCREW LOCK 18X3.5X HEXALOBE (Screw) ×1 IMPLANT
SCREW LOCKING 3.5X18MM (Screw) ×2 IMPLANT
SLING ARM FOAM STRAP LRG (SOFTGOODS) ×3 IMPLANT
SLING ARM LRG ADULT FOAM STRAP (SOFTGOODS) ×3 IMPLANT
SLING ARM MED ADULT FOAM STRAP (SOFTGOODS) IMPLANT
STRIP CLOSURE SKIN 1/2X4 (GAUZE/BANDAGES/DRESSINGS) ×2 IMPLANT
SUCTION FRAZIER HANDLE 10FR (MISCELLANEOUS) ×2
SUCTION TUBE FRAZIER 10FR DISP (MISCELLANEOUS) ×1 IMPLANT
SUT FIBERWIRE #2 38 T-5 BLUE (SUTURE) ×3
SUT MON AB 4-0 PC3 18 (SUTURE) ×3 IMPLANT
SUT PDS AB 1 CT  36 (SUTURE) ×2
SUT PDS AB 1 CT 36 (SUTURE) ×1 IMPLANT
SUT VIC AB 1 CT1 27 (SUTURE) ×2
SUT VIC AB 1 CT1 27XBRD ANBCTR (SUTURE) ×1 IMPLANT
SUT VICRYL 0 CT 1 36IN (SUTURE) IMPLANT
SUTURE FIBERWR #2 38 T-5 BLUE (SUTURE) ×1 IMPLANT
SYR CONTROL 10ML LL (SYRINGE) ×3 IMPLANT
TOWEL OR 17X24 6PK STRL BLUE (TOWEL DISPOSABLE) ×3 IMPLANT
TOWEL OR 17X26 10 PK STRL BLUE (TOWEL DISPOSABLE) ×3 IMPLANT
WATER STERILE IRR 1000ML POUR (IV SOLUTION) IMPLANT

## 2016-01-14 NOTE — Transfer of Care (Signed)
Immediate Anesthesia Transfer of Care Note  Patient: Joel Salazar  Procedure(s) Performed: Procedure(s): OPEN REDUCTION INTERNAL FIXATION (ORIF) CLAVICULAR FRACTURE (Right)  Patient Location: PACU  Anesthesia Type:GA combined with regional for post-op pain  Level of Consciousness: awake, alert  and oriented  Airway & Oxygen Therapy: Patient Spontanous Breathing  Post-op Assessment: Report given to RN and Post -op Vital signs reviewed and stable  Post vital signs: Reviewed and stable  Last Vitals:  Vitals:   01/14/16 1912 01/14/16 1915  BP: (!) (P) 164/108 (!) 164/108  Pulse: 65 99  Resp: 15 18  Temp: (P) 36.3 C     Last Pain:  Vitals:   01/14/16 1301  TempSrc:   PainSc: 7       Patients Stated Pain Goal: 7 (01/14/16 1301)  Complications: No apparent anesthesia complications

## 2016-01-14 NOTE — Anesthesia Procedure Notes (Signed)
Procedure Name: Intubation Date/Time: 01/14/2016 5:30 PM Performed by: Gwenyth AllegraADAMI, Xareni Kelch Pre-anesthesia Checklist: Emergency Drugs available, Patient identified, Suction available, Patient being monitored and Timeout performed Patient Re-evaluated:Patient Re-evaluated prior to inductionOxygen Delivery Method: Circle system utilized Preoxygenation: Pre-oxygenation with 100% oxygen Intubation Type: IV induction Ventilation: Mask ventilation without difficulty Laryngoscope Size: Mac and 4 Grade View: Grade I Tube type: Oral Tube size: 7.0 mm Number of attempts: 1 Airway Equipment and Method: Stylet Secured at: 22 cm Tube secured with: Tape Dental Injury: Teeth and Oropharynx as per pre-operative assessment

## 2016-01-14 NOTE — Anesthesia Preprocedure Evaluation (Signed)
Anesthesia Evaluation  Patient identified by MRN, date of birth, ID band Patient awake    Reviewed: Allergy & Precautions, NPO status , Patient's Chart, lab work & pertinent test results  History of Anesthesia Complications Negative for: history of anesthetic complications  Airway Mallampati: I       Dental   Pulmonary shortness of breath, Current Smoker, former smoker,    breath sounds clear to auscultation       Cardiovascular negative cardio ROS   Rate:Normal     Neuro/Psych negative neurological ROS     GI/Hepatic negative GI ROS, (+)     substance abuse  marijuana use,   Endo/Other  negative endocrine ROS  Renal/GU negative Renal ROS     Musculoskeletal  (+) Arthritis ,   Abdominal   Peds  Hematology negative hematology ROS (+)   Anesthesia Other Findings   Reproductive/Obstetrics negative OB ROS                             Anesthesia Physical Anesthesia Plan  ASA: II  Anesthesia Plan: General   Post-op Pain Management:    Induction: Intravenous  Airway Management Planned: Oral ETT  Additional Equipment:   Intra-op Plan:   Post-operative Plan: Extubation in OR  Informed Consent: I have reviewed the patients History and Physical, chart, labs and discussed the procedure including the risks, benefits and alternatives for the proposed anesthesia with the patient or authorized representative who has indicated his/her understanding and acceptance.   Dental advisory given  Plan Discussed with: CRNA  Anesthesia Plan Comments:         Anesthesia Quick Evaluation
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
## 2016-01-14 NOTE — Anesthesia Procedure Notes (Addendum)
Anesthesia Regional Block:  Interscalene brachial plexus block  Pre-Anesthetic Checklist: ,, timeout performed, Correct Patient, Correct Site, Correct Laterality, Correct Procedure, Correct Position, site marked, Risks and benefits discussed,  Surgical consent,  Pre-op evaluation,  At surgeon's request and post-op pain management  Laterality: Right  Prep: Maximum Sterile Barrier Precautions used, chloraprep       Needles:  Injection technique: Single-shot  Needle Type: Stimulator Needle - 80     Needle Length: 9cm 9 cm Needle Gauge: 22 and 22 G    Additional Needles: Interscalene brachial plexus block Narrative:  Start time: 01/23/2016 5:45 PM End time: 01/23/2016 5:56 PM Injection made incrementally with aspirations every 5 mL.

## 2016-01-14 NOTE — Discharge Instructions (Signed)

## 2016-01-14 NOTE — Anesthesia Postprocedure Evaluation (Signed)
Anesthesia Post Note  Patient: Joel Salazar  Procedure(s) Performed: Procedure(s) (LRB): OPEN REDUCTION INTERNAL FIXATION (ORIF) CLAVICULAR FRACTURE (Right)  Patient location during evaluation: PACU Anesthesia Type: General and Regional Level of consciousness: awake Pain management: pain level controlled Vital Signs Assessment: post-procedure vital signs reviewed and stable Respiratory status: spontaneous breathing Cardiovascular status: stable Postop Assessment: no signs of nausea or vomiting Anesthetic complications: no    Last Vitals:  Vitals:   01/14/16 1930 01/14/16 1945  BP: (!) 161/116 (!) 162/80  Pulse: 93 95  Resp: (!) 22 (!) 22  Temp:      Last Pain:  Vitals:   01/14/16 1301  TempSrc:   PainSc: 7                  Lerin Jech
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
## 2016-01-15 ENCOUNTER — Encounter (HOSPITAL_COMMUNITY): Payer: Self-pay | Admitting: Orthopedic Surgery

## 2016-01-15 MED FILL — Oxycodone w/ Acetaminophen Tab 5-325 MG: ORAL | Qty: 6 | Status: AC

## 2016-01-23 NOTE — Anesthesia Procedure Notes (Addendum)
Anesthesia Regional Block:  Interscalene brachial plexus block  Pre-Anesthetic Checklist: ,, timeout performed, Correct Patient, Correct Site, Correct Laterality, Correct Procedure, Correct Position, site marked, surgical consent, pre-op evaluation,  At surgeon's request and post-op pain management  Laterality: Right  Prep: chloraprep       Needles:  Injection technique: Single-shot  Needle Type: Stimulator Needle - 80      Needle Gauge: 22 and 22 G    Additional Needles: Interscalene brachial plexus block Narrative:  Start time: 01/23/2016 5:45 PM End time: 01/23/2016 5:50 PM Injection made incrementally with aspirations every 5 mL.  Performed by: Personally   Additional Notes: 30cc 0.5% bupivacaine injected easily

## 2017-07-08 DIAGNOSIS — R69 Illness, unspecified: Secondary | ICD-10-CM | POA: Diagnosis not present

## 2017-07-08 DIAGNOSIS — G8929 Other chronic pain: Secondary | ICD-10-CM | POA: Diagnosis not present

## 2017-07-08 DIAGNOSIS — Z825 Family history of asthma and other chronic lower respiratory diseases: Secondary | ICD-10-CM | POA: Diagnosis not present

## 2017-10-27 DIAGNOSIS — H6121 Impacted cerumen, right ear: Secondary | ICD-10-CM | POA: Diagnosis not present

## 2017-10-27 DIAGNOSIS — H6091 Unspecified otitis externa, right ear: Secondary | ICD-10-CM | POA: Diagnosis not present

## 2018-08-08 DIAGNOSIS — R69 Illness, unspecified: Secondary | ICD-10-CM | POA: Diagnosis not present

## 2018-08-09 DIAGNOSIS — R69 Illness, unspecified: Secondary | ICD-10-CM | POA: Diagnosis not present

## 2019-05-02 DIAGNOSIS — M1990 Unspecified osteoarthritis, unspecified site: Secondary | ICD-10-CM | POA: Diagnosis not present

## 2019-05-02 DIAGNOSIS — I739 Peripheral vascular disease, unspecified: Secondary | ICD-10-CM | POA: Diagnosis not present

## 2019-05-02 DIAGNOSIS — R030 Elevated blood-pressure reading, without diagnosis of hypertension: Secondary | ICD-10-CM | POA: Diagnosis not present

## 2019-05-02 DIAGNOSIS — R32 Unspecified urinary incontinence: Secondary | ICD-10-CM | POA: Diagnosis not present

## 2019-05-02 DIAGNOSIS — Z7982 Long term (current) use of aspirin: Secondary | ICD-10-CM | POA: Diagnosis not present

## 2019-05-02 DIAGNOSIS — Z8249 Family history of ischemic heart disease and other diseases of the circulatory system: Secondary | ICD-10-CM | POA: Diagnosis not present

## 2019-05-02 DIAGNOSIS — N529 Male erectile dysfunction, unspecified: Secondary | ICD-10-CM | POA: Diagnosis not present

## 2019-05-02 DIAGNOSIS — G8929 Other chronic pain: Secondary | ICD-10-CM | POA: Diagnosis not present

## 2019-05-02 DIAGNOSIS — R69 Illness, unspecified: Secondary | ICD-10-CM | POA: Diagnosis not present

## 2019-06-22 ENCOUNTER — Other Ambulatory Visit: Payer: Self-pay

## 2019-06-22 ENCOUNTER — Encounter (HOSPITAL_COMMUNITY): Payer: Self-pay

## 2019-06-22 ENCOUNTER — Emergency Department (HOSPITAL_COMMUNITY)
Admission: EM | Admit: 2019-06-22 | Discharge: 2019-06-22 | Disposition: A | Discharge status: Home / Self Care | Payer: Medicare HMO | Attending: Emergency Medicine | Admitting: Emergency Medicine

## 2019-06-22 ENCOUNTER — Emergency Department (HOSPITAL_COMMUNITY): Payer: Medicare HMO

## 2019-06-22 DIAGNOSIS — F17210 Nicotine dependence, cigarettes, uncomplicated: Secondary | ICD-10-CM | POA: Diagnosis not present

## 2019-06-22 DIAGNOSIS — M5441 Lumbago with sciatica, right side: Secondary | ICD-10-CM | POA: Diagnosis not present

## 2019-06-22 DIAGNOSIS — M5136 Other intervertebral disc degeneration, lumbar region: Secondary | ICD-10-CM | POA: Insufficient documentation

## 2019-06-22 DIAGNOSIS — Z79899 Other long term (current) drug therapy: Secondary | ICD-10-CM | POA: Diagnosis not present

## 2019-06-22 DIAGNOSIS — R69 Illness, unspecified: Secondary | ICD-10-CM | POA: Diagnosis not present

## 2019-06-22 DIAGNOSIS — M5489 Other dorsalgia: Secondary | ICD-10-CM | POA: Diagnosis present

## 2019-06-22 DIAGNOSIS — M545 Low back pain: Secondary | ICD-10-CM | POA: Diagnosis not present

## 2019-06-22 MED ORDER — CYCLOBENZAPRINE HCL 10 MG PO TABS: 10.0000 mg | ORAL_TABLET | Freq: Two times a day (BID) | ORAL | 0 refills | Status: DC | PRN

## 2019-06-22 MED ORDER — NAPROXEN 500 MG PO TABS: 500.0000 mg | ORAL_TABLET | Freq: Two times a day (BID) | ORAL | 0 refills | Status: DC

## 2019-06-22 MED ORDER — PREDNISONE 10 MG PO TABS: 20.0000 mg | ORAL_TABLET | Freq: Every day | ORAL | 0 refills | Status: DC

## 2019-06-22 MED ORDER — HYDROMORPHONE HCL 1 MG/ML IJ SOLN
1.0000 mg | Freq: Once | INTRAMUSCULAR | Status: AC
  Administered 2019-06-22: 1 mg via INTRAMUSCULAR
  Filled 2019-06-22: qty 1
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
## 2019-06-22 NOTE — Discharge Instructions (Addendum)
X-ray shows degenerative disc disease.  Minimize vigorous activity.  Recommend ice pack for several days, then heat.  Follow-up with Dr. Ophelia Charter.  Prescriptions called into your pharmacy.

## 2019-06-22 NOTE — ED Provider Notes (Signed)
[facility] EMERGENCY DEPARTMENT Provider Note   CSN: [id number] Arrival date & time: 06/22/19  0865     History Chief Complaint  Patient presents with  . Back Pain    Joel Salazar is a 49 y.o. male.  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  Patient was kneeling at the time and tried to bend over and felt immediate pain.  Previous low back surgery by Dr. Prince Solian many years ago for "disc problems".  No bowel or bladder continence.  Patient has taken OTC meds without relief.  Severity is moderate.  Position and palpation make pain worse.        Past Medical History:  Diagnosis Date  . Arthritis   . Shortness of breath dyspnea    due to rib fractures    There are no problems to display for this patient.   Past Surgical History:  Procedure Laterality Date  . ACETABULUM FRACTURE SURGERY Left   . APPENDECTOMY    . BACK SURGERY     Disectomy  . FEMUR SURGERY Right    rod placed  . ORIF CLAVICULAR FRACTURE Right 01/14/2016   Procedure: OPEN REDUCTION INTERNAL FIXATION (ORIF) CLAVICULAR FRACTURE;  Surgeon: Jones Broom, MD;  Location: [facility] OR;  Service: Orthopedics;  Laterality: Right;       No family history on file.  Social History   Tobacco Use  . Smoking status: Current Every Day Smoker    Packs/day: 2.00    Years: 22.00    Pack years: 44.00  . Smokeless tobacco: Never Used  Substance Use Topics  . Alcohol use: Yes    Comment: 4 or 5 beers/night  . Drug use: Not Currently    Types: Marijuana    Home Medications Prior to Admission medications   Medication Sig Start Date End Date Taking? Authorizing Provider  cyclobenzaprine (FLEXERIL) 10 MG tablet Take 1 tablet (10 mg total) by mouth 2 (two) times daily as needed. 06/22/19   Donnetta Hutching, MD  naproxen (NAPROSYN) 500 MG tablet Take 1 tablet (500 mg total) by mouth 2 (two) times daily. 06/22/19   Donnetta Hutching, MD    oxyCODONE-acetaminophen (PERCOCET) 5-325 MG tablet Take 1 tablet by mouth every 4 (four) hours as needed for moderate pain. 01/11/16   Dione Booze, MD  oxyCODONE-acetaminophen (PERCOCET) 5-325 MG tablet Take 1-2 tablets by mouth every 4 (four) hours as needed for moderate pain. 01/14/16   Jiles Harold, PA-C  predniSONE (DELTASONE) 10 MG tablet Take 2 tablets (20 mg total) by mouth daily. 06/22/19   Donnetta Hutching, MD    Allergies    Patient has no known allergies.  Review of Systems   Review of Systems  All other systems reviewed and are negative.   Physical Exam Updated Vital Signs BP (!) 120/93 (BP Location: Right Arm)   Pulse 84   Temp 98.3 F (36.8 C) (Oral)   Resp 20   Ht 5\' 11"  (1.803 m)   Wt 70.3 kg   SpO2 100%   BMI 21.62 kg/m   Physical Exam Vitals and nursing note reviewed.  Constitutional:      Appearance: He is well-developed.  HENT:     Head: Normocephalic and atraumatic.  Eyes:     Conjunctiva/sclera: Conjunctivae normal.  Cardiovascular:     Rate and Rhythm: Normal rate and regular rhythm.  Pulmonary:     Effort: Pulmonary effort is normal.  Breath sounds: Normal breath sounds.  Abdominal:     General: Bowel sounds are normal.     Palpations: Abdomen is soft.  Musculoskeletal:     Cervical back: Neck supple.     Comments: Tender lumbar spine.  Pain with straight leg raise bilaterally right greater than left  Skin:    General: Skin is warm and dry.  Neurological:     General: No focal deficit present.     Mental Status: He is alert and oriented to person, place, and time.  Psychiatric:        Behavior: Behavior normal.     ED Results / Procedures / Treatments   Labs (all labs ordered are listed, but only abnormal results are displayed) Labs Reviewed - No data to display  EKG None  Radiology DG Lumbar Spine Complete  Result Date: 06/22/2019 CLINICAL DATA:  Low back pain. Bilateral lower extremity radiculopathy, right greater than  left. EXAM: LUMBAR SPINE - COMPLETE 4+ VIEW COMPARISON:  Lumbar MRI dated 01/31/2007 FINDINGS: Five typical lumbar segments. Lateral alignment is normal. Slight disc space narrowing at L3-4 and L5-S1. No facet arthritis. Evidence of prior surgery at L4-5 on the right. No acute abnormality. Aortic atherosclerosis. Multiple plates and screws in the left hemipelvis for repair of pelvic fractures. IMPRESSION: 1. Slight degenerative disc disease at L3-4 and L5-S1. 2. Aortic atherosclerosis. Electronically Signed   By: Lorriane Shire M.D.   On: 06/22/2019 08:55    Procedures Procedures (including critical care time)  Medications Ordered in ED Medications  HYDROmorphone (DILAUDID) injection 1 mg (1 mg Intramuscular Given 06/22/19 0755)    ED Course  I have reviewed the triage vital signs and the nursing notes.  Pertinent labs & imaging results that were available during my care of the patient were reviewed by me and considered in my medical decision making (see chart for details).    MDM Rules/Calculators/A&P                      Plain films of lumbar spine reveal degenerative disc disease at L3, 4 and L5, S1.  Dilaudid 1 mg IM administered for pain.  Discharge medication Flexeril 10 mg, prednisone, Naprosyn 500 mg.  Follow-up with Dr. Lorin Mercy. Final Clinical Impression(s) / ED Diagnoses Final diagnoses:  Acute bilateral low back pain with right-sided sciatica  Degenerative disc disease, lumbar    Rx / DC Orders ED Discharge Orders         Ordered    cyclobenzaprine (FLEXERIL) 10 MG tablet  2 times daily PRN     06/22/19 0922    predniSONE (DELTASONE) 10 MG tablet  Daily     06/22/19 0922    naproxen (NAPROSYN) 500 MG tablet  2 times daily     06/22/19 6568           Nat Christen, MD 06/22/19 [phone]

## 2019-06-27 ENCOUNTER — Encounter: Payer: Self-pay | Admitting: Orthopaedic Surgery

## 2019-06-27 ENCOUNTER — Ambulatory Visit (INDEPENDENT_AMBULATORY_CARE_PROVIDER_SITE_OTHER): Payer: Medicare HMO | Admitting: Orthopaedic Surgery

## 2019-06-27 ENCOUNTER — Other Ambulatory Visit: Payer: Self-pay

## 2019-06-27 VITALS — BP 145/87 | HR 96 | Ht 71.0 in | Wt 155.0 lb

## 2019-06-27 DIAGNOSIS — M545 Low back pain, unspecified: Secondary | ICD-10-CM

## 2019-06-27 MED ORDER — HYDROCODONE-ACETAMINOPHEN 5-325 MG PO TABS: 1.0000 | ORAL_TABLET | Freq: Four times a day (QID) | ORAL | 0 refills | Status: DC | PRN

## 2019-06-27 NOTE — Progress Notes (Signed)
Office Visit Note   Patient: Joel Salazar           Date of Birth: July 21, 1970           MRN: [id number] Visit Date: 06/27/2019              Requested by: No referring provider defined for this encounter. PCP: Patient, No Pcp Per   Assessment & Plan: Visit Diagnoses: No diagnosis found.  Plan: We will set patient up for some physical therapy at at [facility] since he lives in [place].  20 tablets Norco 5/325 sent in with no refills.  He can continue the Naprosyn and when this runs out switch to Aleve.  Continue heating pad/ice.  Recheck 4 weeks.  Follow-Up Instructions: No follow-ups on file.   Orders:  No orders of the defined types were placed in this encounter.  No orders of the defined types were placed in this encounter.     Procedures: No procedures performed   Clinical Data: No additional findings.   Subjective: Chief Complaint  Patient presents with  . Lower Back - Pain    HPI 49 year old male [facility] 06/[facility]/2019 with low back pain due to an injury on 06/20/2019.  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  He has had back pain that radiates into both legs worse on the right than left.  He describes it as a burning pain and has increased pain trying to stand up straight.  He relates a history of his pelvic fracture injury and plates and screws that were placed and I reminded him that I was the orthopedist to send him to Dr.Handy to have his surgery.  Pelvic fractures are well-healed without posttraumatic arthritis.  Patient states he has had some prednisone it really has not helped.  He describes the pain is constant.  Patient is not working he states he is on disability.  In emergency room he was given Flexeril prednisone and Naprosyn.  Previous L4-5 surgery with microdiscectomy by me 2009 on the right. [place] narcotic website reviewed last prescription 08/09/2018 20 tablets of Norco  5/325. Review of Systems previous microdiscectomy right L4-5 2009.  Pelvic fracture treated with plate fixation Dr. Marcelino Scot 2010.  Clavicle fixation Dr. Tamera Punt 2017. Otherwise noncontributory to HPI. Objective: Vital Signs: BP (!) 145/87   Pulse 96   Ht 5\' 11"  (1.803 m)   Wt 155 lb (70.3 kg)   BMI 21.62 kg/m   Physical Exam Constitutional:      Appearance: He is well-developed.  HENT:     Head: Normocephalic and atraumatic.  Eyes:     Pupils: Pupils are equal, round, and reactive to light.  Neck:     Thyroid: No thyromegaly.     Trachea: No tracheal deviation.  Cardiovascular:     Rate and Rhythm: Normal rate.  Pulmonary:     Effort: Pulmonary effort is normal.     Breath sounds: No wheezing.  Abdominal:     General: Bowel sounds are normal.     Palpations: Abdomen is soft.  Skin:    General: Skin is warm and dry.     Capillary Refill: Capillary refill takes less than 2 seconds.  Neurological:     Mental Status: He is alert and oriented to person, place, and time.  Psychiatric:        Behavior: Behavior normal.  Thought Content: Thought content normal.        Judgment: Judgment normal.     Ortho Exam well-healed lumbar incision L4-5 on the right.  No cellulitis no tenderness mild sciatic notch tenderness some pain with straight leg raising 80 degrees both right and left negative logroll of the hips knee and ankle jerk are intact anterior tib gastrocsoleus is intact.  Paraspinal tenderness present.  He is able to heel and toe walk has difficulty reaching full upright position walks with his hips in flexed position.  Specialty Comments:  No specialty comments available.  Imaging: No results found.   PMFS History: There are no problems to display for this patient.  Past Medical History:  Diagnosis Date  . Arthritis   . Shortness of breath dyspnea    due to rib fractures    No family history on file.  Past Surgical History:  Procedure Laterality Date   . ACETABULUM FRACTURE SURGERY Left   . APPENDECTOMY    . BACK SURGERY     Disectomy  . FEMUR SURGERY Right    rod placed  . ORIF CLAVICULAR FRACTURE Right 01/14/2016   Procedure: OPEN REDUCTION INTERNAL FIXATION (ORIF) CLAVICULAR FRACTURE;  Surgeon: Jones Broom, MD;  Location: [facility] OR;  Service: Orthopedics;  Laterality: Right;   Social History   Occupational History  . Not on file  Tobacco Use  . Smoking status: Current Every Day Smoker    Packs/day: 2.00    Years: 22.00    Pack years: 44.00  . Smokeless tobacco: Never Used  Substance and Sexual Activity  . Alcohol use: Yes    Comment: 4 or 5 beers/night  . Drug use: Not Currently    Types: Marijuana  . Sexual activity: Not on file

## 2019-06-28 ENCOUNTER — Ambulatory Visit (HOSPITAL_COMMUNITY): Payer: Medicare HMO | Attending: Orthopaedic Surgery | Admitting: Physical Therapy

## 2019-06-28 DIAGNOSIS — M5441 Lumbago with sciatica, right side: Secondary | ICD-10-CM | POA: Diagnosis not present

## 2019-06-28 DIAGNOSIS — M5442 Lumbago with sciatica, left side: Secondary | ICD-10-CM | POA: Diagnosis not present

## 2019-06-28 NOTE — Therapy (Signed)
Joel Salazar, [place], [address] Phone: [phone]   Fax:  [phone]  Physical Therapy Evaluation  Patient Details  Name: Joel Salazar MRN: [id number] Date of Birth: 24-Sep-1970 Referring Provider (PT): mark yates   Encounter Date: 06/28/2019  PT End of Session - 06/28/19 1054    Visit Number  1    Number of Visits  8    Date for PT Re-Evaluation  07/26/19    Authorization Type  no VL, copay $35, No ded. [employer]    Authorization Time Period  POC dates 06/28/19 to 07/26/2019    Progress Note Due on Visit  8    PT Start Time  1125    PT Stop Time  1205    PT Time Calculation (min)  40 min       Past Medical History:  Diagnosis Date  . Arthritis   . Shortness of breath dyspnea    due to rib fractures    Past Surgical History:  Procedure Laterality Date  . ACETABULUM FRACTURE SURGERY Left   . APPENDECTOMY    . BACK SURGERY     Disectomy  . FEMUR SURGERY Right    rod placed  . ORIF CLAVICULAR FRACTURE Right 01/14/2016   Procedure: OPEN REDUCTION INTERNAL FIXATION (ORIF) CLAVICULAR FRACTURE;  Surgeon: Tania Ade, MD;  Location: [facility];  Service: Orthopedics;  Laterality: Right;    There were no vitals filed for this visit.   Subjective Assessment - 06/28/19 1218    Subjective  Patient complains of low back pain that started last Tuesday. States he was at a buddy's house and he bent down to light a pilot light on the heater in the green house and when he went to get back up he had a sharp shooting pain. States that he has had chronic low back pain for ever but this was different. States he was in such terrible pain he needed his girlfriend to help him walk that day. the next day he went to the ED and they gave him muscle relaxers and naproxen which seemed to help a little. States he is doing a bit better. He was able to sleep in the bed for the first time since the injury last night (prior to he was  sleeping in the recliner.    Limitations  Sitting;Lifting;Standing;Walking    Currently in Pain?  Yes    Pain Score  5     Pain Location  Back    Pain Orientation  Mid    Pain Descriptors / Indicators  Aching;Stabbing;Sharp    Pain Type  Acute pain    Pain Radiating Towards  towards right side    Pain Onset  1 to 4 weeks ago    Pain Frequency  Constant    Aggravating Factors   movement, bending, lifting, twisting         [facility] PT Assessment - 06/28/19 0001      Assessment   Medical Diagnosis  low back pain    Referring Provider (PT)  mark yates      Precautions   Precautions  None      Restrictions   Weight Bearing Restrictions  No      Balance Screen   Has the patient fallen in the past 6 months  No      Prior Function   Level of Independence  Independent      Cognition   Overall Cognitive Status  Within Functional Limits for tasks assessed      Observation/Other Assessments   Focus on Therapeutic Outcomes (FOTO)   74% limited      Posture/Postural Control   Posture Comments  rounded shoulders, decreased lumbar lordosis      ROM / Strength   AROM / PROM / Strength  AROM;Strength      AROM   AROM Assessment Site  Lumbar    Lumbar Flexion  25% limited    painin low back and in both legs   Lumbar Extension  100% limited   pain in low back    Lumbar - Right Side Bend  50% limited   pain on right side of low back    Lumbar - Left Side Bend  75% limited   pain on left side of low back    Lumbar - Right Rotation  100% limited   no change in symptoms    Lumbar - Left Rotation  100% limited   no change in symptoms      Strength   Strength Assessment Site  Knee;Hip    Right/Left Hip  Right;Left    Right Hip Extension  4/5   pain in low back    Left Hip Extension  4/5   pain in low back   Right/Left Knee  Right;Left      Palpation   Spinal mobility  hypomobility with PA spring testing in lumbar and thoracic spine    Palpation comment  tenderenss to  palpation in bilateral QL and lumbar paraspinals.       Special Tests   Other special tests  traction - relief in low back noted; repeated lumbar extension prone - felt good but continued low back pain noted - improved ROM                 Objective measurements completed on examination: See above findings.      [facility] Adult PT Treatment/Exercise - 06/28/19 0001      Exercises   Exercises  Lumbar      Lumbar Exercises: Stretches   Lower Trunk Rotation  60 seconds;5 reps   on exercises ball first by PT then by patient     Manual Therapy   Manual Therapy  Joint mobilization;Soft tissue mobilization;Manual Traction    Manual therapy comments  all manual interventions performed independently of other interventions    Joint Mobilization  PA to lumbar spine grade II/II, medial glide to L3 grade III bilaterally    Soft tissue mobilization  STM/TPR to B QL - improved resting tone noted     Manual Traction  manual traction performed on green ball by PT- 45" bouts x3             PT Education - 06/28/19 1219    Education Details  on current condition, importance of movement, centralization vs peripherilization.    Person(s) Educated  Patient    Methods  Explanation;Handout    Comprehension  Verbalized understanding       PT Short Term Goals - 06/28/19 1059      PT SHORT TERM GOAL #1   Title  Patient will be independent in HEP to improve functional outcomes.    Time  2    Period  Weeks    Status  New    Target Date  07/12/19      PT SHORT TERM GOAL #2   Title  Patient will report at least 25% improvement to improve tolerance to  functional tasks.    Time  2    Period  Weeks    Status  New    Target Date  07/12/19        PT Long Term Goals - 06/28/19 1100      PT LONG TERM GOAL #1   Title  Patient will report at least 50% improvement to improve tolerance to functional tasks.    Time  4    Period  Weeks    Status  New    Target Date  07/26/19      PT  LONG TERM GOAL #2   Title  Patient will be able to perform painfree lumbar ROM to improve gross mobility.    Time  4    Period  Weeks    Status  New    Target Date  07/26/19      PT LONG TERM GOAL #3   Title  Paitent will score with <60% impairment on FOTO to improve functional mobility and tolerance.    Time  4    Period  Weeks    Status  New    Target Date  07/26/19             Plan - 06/28/19 1223    Clinical Impression Statement  Patient presents to clinic with acute low back pain after bending forward in low squat last week. Patient responded well to today's session reporting minimal to no pain after supine and prone lumbar interventions. Educated patient on current presentation and answered all questions about POC and HEP. Patient would greatly benefit from skilled physical therapy to improve overall function and return to prior level of activity.    Personal Factors and Comorbidities  Comorbidity 1    Comorbidities  hx of lumbar surgery 2009    Examination-Activity Limitations  Bend;Squat;Sleep;Sit;Stand;Dressing;Transfers;Lift;Locomotion Level    Examination-Participation Restrictions  Cleaning;Driving;Yard Work;Community Activity;Laundry;Meal Prep    Stability/Clinical Decision Making  Stable/Uncomplicated    Clinical Decision Making  Low    Rehab Potential  Excellent    PT Frequency  2x / week    PT Duration  4 weeks    PT Treatment/Interventions  ADLs/Self Care Home Management;Aquatic Therapy;Cryotherapy;Electrical Stimulation;Moist Heat;Traction;Balance training;Therapeutic exercise;Therapeutic activities;Functional mobility training;Stair training;Gait training;Neuromuscular re-education;Patient/family education;Manual techniques;Dry needling;Passive range of motion;Spinal Manipulations    PT Next Visit Plan  f/u with tolerance to HEP, lumbar mobility, HEP development, traction, PA mobilizations and QL    PT Home Exercise Plan  3/10 lumbar rotation, POE    Consulted  and Agree with Plan of Care  Patient       Patient will benefit from skilled therapeutic intervention in order to improve the following deficits and impairments:  Pain, Decreased strength, Hypomobility, Decreased range of motion, Decreased mobility  Visit Diagnosis: Acute midline low back pain with bilateral sciatica     Problem List There are no problems to display for this patient.   1:50 PM, 06/28/19 Tereasa Coop, DPT Physical Therapy with [facility]  [phone] office   [facility] [address] [place], [place], [address] Phone: [phone]   Fax:  [phone]  Name: Azazel Franze MRN: [id number] Date of Birth: Oct 21, 1970

## 2019-06-28 NOTE — Patient Instructions (Signed)
Access Code: [email or web address]  URL: [email or web address]  Date: 06/28/2019  Prepared by: Revonda Humphrey   Exercises Supine Lumbar Rotation with Swiss Ball - 15 reps - 2 sets - 3x daily Prone Press Up on Elbows - 10 reps - 2 sets - 3x daily Supine Lower Trunk Rotation - 15 reps - 2 sets - 3x daily

## 2019-07-04 ENCOUNTER — Ambulatory Visit (HOSPITAL_COMMUNITY): Payer: Medicare HMO | Admitting: Physical Therapy

## 2019-07-04 ENCOUNTER — Encounter (HOSPITAL_COMMUNITY): Payer: Self-pay | Admitting: Physical Therapy

## 2019-07-04 ENCOUNTER — Other Ambulatory Visit: Payer: Self-pay

## 2019-07-04 DIAGNOSIS — M5441 Lumbago with sciatica, right side: Secondary | ICD-10-CM | POA: Diagnosis not present

## 2019-07-04 DIAGNOSIS — M5442 Lumbago with sciatica, left side: Secondary | ICD-10-CM

## 2019-07-04 NOTE — Therapy (Signed)
[facility] [address] [place], [place], [address] Phone: [phone]   Fax:  [phone]  Physical Therapy Treatment and Discharge Note  Patient Details  Name: Joel Salazar MRN: [id number] Date of Birth: 1970-10-27 Referring Provider (PT): mark yates  PHYSICAL THERAPY DISCHARGE SUMMARY  Visits from Start of Care: 2  Current functional level related to goals / functional outcomes: See below   Remaining deficits: Continued discomfort   Education / Equipment: See below  Plan: Patient agrees to discharge.  Patient goals were partially met. Patient is being discharged due to being pleased with the current functional level.  ?????       Encounter Date: 07/04/2019  PT End of Session - 07/04/19 6754    Visit Number  2    Number of Visits  8    Date for PT Re-Evaluation  07/26/19    Authorization Type  no VL, copay $35, No ded. [employer]    Authorization Time Period  POC dates 06/28/19 to 07/26/2019    Progress Note Due on Visit  8    PT Start Time  0917    PT Stop Time  0957    PT Time Calculation (min)  40 min       Past Medical History:  Diagnosis Date  . Arthritis   . Shortness of breath dyspnea    due to rib fractures    Past Surgical History:  Procedure Laterality Date  . ACETABULUM FRACTURE SURGERY Left   . APPENDECTOMY    . BACK SURGERY     Disectomy  . FEMUR SURGERY Right    rod placed  . ORIF CLAVICULAR FRACTURE Right 01/14/2016   Procedure: OPEN REDUCTION INTERNAL FIXATION (ORIF) CLAVICULAR FRACTURE;  Surgeon: Tania Ade, MD;  Location: [facility];  Service: Orthopedics;  Laterality: Right;    There were no vitals filed for this visit.  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 No pain or discomfort and he feels like he back to where it was and states he has been doing the standing exercises but not the laying down ones much.          [facility] PT Assessment - 07/04/19 0001      Assessment   Medical Diagnosis  low back pain    Referring Provider (PT)  mark yates      AROM   Lumbar Flexion  25% limited    no pain   Lumbar Extension  50% limited   just  stretching   Lumbar - Right Side Bend  25% limited   no pain    Lumbar - Left Side Bend  50% limited     Lumbar - Right Rotation  50% limited    Lumbar - Left Rotation  50% limited      Strength   Right Hip Extension  4+/5   no pain    Left Hip Extension  4+/5   no pain      Flexibility   Soft Tissue Assessment /Muscle Length  yes                   [facility] Adult PT Treatment/Exercise - 07/04/19 0001      Lumbar Exercises: Standing   Other Standing Lumbar Exercises  lumbar extension x10, lumbar extension with rotation x10 B 5" holds, then at wall - each exercise additional time.  Other Standing Lumbar Exercises  self mobilization with lacrosse ball to lumbar and thoracic paraspinals - 5 minutes      Lumbar Exercises: Seated   Other Seated Lumbar Exercises  seated lumbar rotation 3x5, 5" holds B    Other Seated Lumbar Exercises  lumbar flexion seated x10 5" holds       Lumbar Exercises: Prone   Other Prone Lumbar Exercises  hamstring curls 2x10 B    Other Prone Lumbar Exercises  quad stretch with strap 3x30" B             PT Education - 07/04/19 1030    Education Details  on progress, importance of continued HEP adherence. how dysfunctions occur and how to prevent them. Educated patient in possible causes of muscle cramping (dehydration and lack of electrolytes) - currently he is consuming 4 bottles of mountain dew a day and does no drink much water - discussed how this is fueling his body and how his cramps are a sign that he needs to increased water/hydrating fluids.    Person(s) Educated  Patient    Methods  Explanation    Comprehension  Verbalized understanding       PT Short Term Goals - 07/04/19 1028      PT SHORT  TERM GOAL #1   Title  Patient will be independent in HEP to improve functional outcomes.    Time  2    Period  Weeks    Status  Achieved    Target Date  07/12/19      PT SHORT TERM GOAL #2   Title  Patient will report at least 25% improvement to improve tolerance to functional tasks.    Baseline  3/16 40% improvement    Time  2    Period  Weeks    Status  Achieved    Target Date  07/12/19        PT Long Term Goals - 07/04/19 1029      PT LONG TERM GOAL #1   Title  Patient will report at least 50% improvement to improve tolerance to functional tasks.    Baseline  40% improvement    Time  4    Period  Weeks    Status  Not Met      PT LONG TERM GOAL #2   Title  Patient will be able to perform painfree lumbar ROM to improve gross mobility.    Baseline  see objective section    Time  4    Period  Weeks    Status  Achieved      PT LONG TERM GOAL #3   Title  Paitent will score with <60% impairment on FOTO to improve functional mobility and tolerance.    Baseline  met    Time  4    Period  Weeks    Status  Achieved            Plan - 07/04/19 1614    Clinical Impression Statement  Patient very satisfied with current progress and would like to be discharged from physical therapy on this date. Educated patient on importance of adherence to HEP and educated patient on additional exercises to help abolish back pain symptoms. Patient reported he understood. Continued discomfort noted with certain movements but patient has met 2 out of 2 short term goals and 2 out of 3 long term goals at this time.  Patient would continue to benefit from skilled physical therapy but is to discharge  from physical therapy secondary to satisfaction with current functional state. Answered all questions prior to end of session.    Personal Factors and Comorbidities  Comorbidity 1    Comorbidities  hx of lumbar surgery 2009    Examination-Activity Limitations   Bend;Squat;Sleep;Sit;Stand;Dressing;Transfers;Lift;Locomotion Level    Examination-Participation Restrictions  Cleaning;Driving;Yard Work;Community Activity;Laundry;Meal Prep    Stability/Clinical Decision Making  Stable/Uncomplicated    Rehab Potential  Excellent    PT Frequency  2x / week    PT Duration  4 weeks    PT Treatment/Interventions  ADLs/Self Care Home Management;Aquatic Therapy;Cryotherapy;Electrical Stimulation;Moist Heat;Traction;Balance training;Therapeutic exercise;Therapeutic activities;Functional mobility training;Stair training;Gait training;Neuromuscular re-education;Patient/family education;Manual techniques;Dry needling;Passive range of motion;Spinal Manipulations    PT Next Visit Plan  patient to discharge from PT to HEP.    PT Home Exercise Plan  3/10 lumbar rotation, POE    Consulted and Agree with Plan of Care  Patient       Patient will benefit from skilled therapeutic intervention in order to improve the following deficits and impairments:  Pain, Decreased strength, Hypomobility, Decreased range of motion, Decreased mobility  Visit Diagnosis: Acute midline low back pain with bilateral sciatica     Problem List There are no problems to display for this patient.  4:17 PM, 07/04/19 Jerene Pitch, DPT Physical Therapy with [facility]  [phone] office  [facility] [address] [place], [place], [address] Phone: [phone]   Fax:  [phone]  Name: Joel Salazar MRN: [id number] Date of Birth: 08-26-1970

## 2019-07-20 ENCOUNTER — Ambulatory Visit: Payer: Medicare HMO | Attending: Internal Medicine

## 2019-07-20 DIAGNOSIS — Z23 Encounter for immunization: Secondary | ICD-10-CM

## 2019-07-20 NOTE — Progress Notes (Signed)
   Covid-19 Vaccination Clinic  Name:  Joel Salazar    MRN: [id number] DOB: [birth year]  07/20/2019  [name] was observed post Covid-19 immunization for 15 minutes without incident. He was provided with Vaccine Information Sheet and instruction to access the V-Safe system.   [name] was instructed to call 911 with any severe reactions post vaccine: Marland Kitchen Difficulty breathing  . Swelling of face and throat  . A fast heartbeat  . A bad rash all over body  . Dizziness and weakness   Immunizations Administered    Name Date Dose VIS Date Route   Moderna COVID-19 Vaccine 07/20/2019  8:51 AM 0.5 mL 03/21/2019 Intramuscular   Manufacturer: Moderna   Lot: [id number]   NDC: 18209-906-89

## 2019-07-25 ENCOUNTER — Ambulatory Visit: Payer: Medicare HMO | Admitting: Orthopaedic Surgery

## 2019-08-16 ENCOUNTER — Ambulatory Visit: Payer: Medicare HMO

## 2019-08-22 ENCOUNTER — Ambulatory Visit: Payer: Medicare HMO

## 2019-08-23 ENCOUNTER — Ambulatory Visit: Payer: Medicare HMO | Attending: Internal Medicine

## 2019-08-23 DIAGNOSIS — Z23 Encounter for immunization: Secondary | ICD-10-CM

## 2019-08-23 NOTE — Progress Notes (Signed)
   Covid-19 Vaccination Clinic  Name:  Dylyn Mclaren    MRN: [id number] DOB: 11/03/1970  08/23/2019  Mr. Hyndman was observed post Covid-19 immunization for 15 minutes without incident. He was provided with Vaccine Information Sheet and instruction to access the V-Safe system.   Mr. Eidem was instructed to call 911 with any severe reactions post vaccine: Marland Kitchen Difficulty breathing  . Swelling of face and throat  . A fast heartbeat  . A bad rash all over body  . Dizziness and weakness   Immunizations Administered    Name Date Dose VIS Date Route   Moderna COVID-19 Vaccine 08/23/2019  8:45 AM 0.5 mL 03/2019 Intramuscular   Manufacturer: Moderna   Lot: [id number]   NDC: 56812-751-70

## 2020-01-04 DIAGNOSIS — L989 Disorder of the skin and subcutaneous tissue, unspecified: Secondary | ICD-10-CM | POA: Diagnosis not present

## 2020-01-04 DIAGNOSIS — J3489 Other specified disorders of nose and nasal sinuses: Secondary | ICD-10-CM | POA: Diagnosis not present

## 2020-01-04 DIAGNOSIS — Z0001 Encounter for general adult medical examination with abnormal findings: Secondary | ICD-10-CM | POA: Diagnosis not present

## 2020-01-04 DIAGNOSIS — Z681 Body mass index (BMI) 19 or less, adult: Secondary | ICD-10-CM | POA: Diagnosis not present

## 2020-01-04 DIAGNOSIS — Z0000 Encounter for general adult medical examination without abnormal findings: Secondary | ICD-10-CM | POA: Diagnosis not present

## 2020-01-04 DIAGNOSIS — Z1389 Encounter for screening for other disorder: Secondary | ICD-10-CM | POA: Diagnosis not present

## 2020-01-04 DIAGNOSIS — I10 Essential (primary) hypertension: Secondary | ICD-10-CM | POA: Diagnosis not present

## 2020-02-01 DIAGNOSIS — Z6820 Body mass index (BMI) 20.0-20.9, adult: Secondary | ICD-10-CM | POA: Diagnosis not present

## 2020-02-01 DIAGNOSIS — R040 Epistaxis: Secondary | ICD-10-CM | POA: Diagnosis not present

## 2020-02-01 DIAGNOSIS — I10 Essential (primary) hypertension: Secondary | ICD-10-CM | POA: Diagnosis not present

## 2020-04-29 DIAGNOSIS — L720 Epidermal cyst: Secondary | ICD-10-CM | POA: Diagnosis not present

## 2020-06-12 ENCOUNTER — Institutional Professional Consult (permissible substitution): Payer: Medicare HMO | Admitting: Plastic Surgery

## 2020-09-02 ENCOUNTER — Encounter (HOSPITAL_COMMUNITY): Payer: Self-pay

## 2020-09-02 ENCOUNTER — Emergency Department (HOSPITAL_COMMUNITY)
Admission: EM | Admit: 2020-09-02 | Discharge: 2020-09-02 | Disposition: A | Discharge status: Home / Self Care | Payer: Medicare HMO | Attending: Emergency Medicine | Admitting: Emergency Medicine

## 2020-09-02 ENCOUNTER — Other Ambulatory Visit: Payer: Self-pay

## 2020-09-02 DIAGNOSIS — I10 Essential (primary) hypertension: Secondary | ICD-10-CM | POA: Insufficient documentation

## 2020-09-02 DIAGNOSIS — X58XXXA Exposure to other specified factors, initial encounter: Secondary | ICD-10-CM | POA: Diagnosis not present

## 2020-09-02 DIAGNOSIS — Z79899 Other long term (current) drug therapy: Secondary | ICD-10-CM | POA: Diagnosis not present

## 2020-09-02 DIAGNOSIS — Z7982 Long term (current) use of aspirin: Secondary | ICD-10-CM | POA: Insufficient documentation

## 2020-09-02 DIAGNOSIS — T1592XA Foreign body on external eye, part unspecified, left eye, initial encounter: Secondary | ICD-10-CM | POA: Insufficient documentation

## 2020-09-02 DIAGNOSIS — F17210 Nicotine dependence, cigarettes, uncomplicated: Secondary | ICD-10-CM | POA: Insufficient documentation

## 2020-09-02 DIAGNOSIS — H5789 Other specified disorders of eye and adnexa: Secondary | ICD-10-CM | POA: Diagnosis not present

## 2020-09-02 DIAGNOSIS — H579 Unspecified disorder of eye and adnexa: Secondary | ICD-10-CM

## 2020-09-02 DIAGNOSIS — R69 Illness, unspecified: Secondary | ICD-10-CM | POA: Diagnosis not present

## 2020-09-02 HISTORY — DX: Essential (primary) hypertension: I10

## 2020-09-02 MED ORDER — POLYMYXIN B-TRIMETHOPRIM 10000-0.1 UNIT/ML-% OP SOLN: 1.0000 [drp] | OPHTHALMIC | 0 refills | Status: DC

## 2020-09-02 MED ORDER — FLUORESCEIN SODIUM 1 MG OP STRP
1.0000 | ORAL_STRIP | Freq: Once | OPHTHALMIC | Status: AC
  Administered 2020-09-02: 1 via OPHTHALMIC
  Filled 2020-09-02: qty 1

## 2020-09-02 MED ORDER — TETRACAINE HCL 0.5 % OP SOLN
2.0000 [drp] | Freq: Once | OPHTHALMIC | Status: AC
  Administered 2020-09-02: 2 [drp] via OPHTHALMIC
  Filled 2020-09-02: qty 4

## 2020-09-02 NOTE — ED Provider Notes (Signed)
[facility] EMERGENCY DEPARTMENT Provider Note   CSN: [id number] Arrival date & time: 09/02/20  2706     History Chief Complaint  Patient presents with  . Foreign Body in [facility] [place] is a 50 y.o. male.  Left eye foreign body sensation.  Persistent for about a week.  Patient states that he felt like sawdust or something went into the air about a week ago.  He flushed out with water from his hose.  However he has had persistent sensation of foreign body in that left eye medial aspect.  Denies any significant vision change.  Denies any fevers vomiting cough or diarrhea.        Past Medical History:  Diagnosis Date  . Arthritis   . Hypertension   . Shortness of breath dyspnea    due to rib fractures    There are no problems to display for this patient.   Past Surgical History:  Procedure Laterality Date  . ACETABULUM FRACTURE SURGERY Left   . APPENDECTOMY    . BACK SURGERY     Disectomy  . FEMUR SURGERY Right    rod placed  . ORIF CLAVICULAR FRACTURE Right 01/14/2016   Procedure: OPEN REDUCTION INTERNAL FIXATION (ORIF) CLAVICULAR FRACTURE;  Surgeon: Jones Broom, MD;  Location: [facility] OR;  Service: Orthopedics;  Laterality: Right;       No family history on file.  Social History   Tobacco Use  . Smoking status: Current Every Day Smoker    Packs/day: 2.00    Years: 22.00    Pack years: 44.00  . Smokeless tobacco: Never Used  Substance Use Topics  . Alcohol use: Yes    Comment: 4 or 5 beers/night  . Drug use: Not Currently    Types: Marijuana    Home Medications Prior to Admission medications   Medication Sig Start Date End Date Taking? Authorizing Provider  aspirin-acetaminophen-caffeine (EXCEDRIN MIGRAINE) [phone] MG tablet Take 1 tablet by mouth every 6 (six) hours as needed for headache.   Yes [provider]  lisinopril (ZESTRIL) 10 MG tablet Take 1 tablet by mouth daily. 05/23/20  Yes [provider]   trimethoprim-polymyxin b (POLYTRIM) ophthalmic solution Place 1 drop into the left eye every 4 (four) hours. 09/02/20  Yes Cheryll Cockayne, MD  cyclobenzaprine (FLEXERIL) 10 MG tablet Take 1 tablet (10 mg total) by mouth 2 (two) times daily as needed. Patient not taking: Reported on 09/02/2020 06/22/19   Donnetta Hutching, MD  HYDROcodone-acetaminophen (NORCO/VICODIN) 5-325 MG tablet Take 1 tablet by mouth every 6 (six) hours as needed for moderate pain. Patient not taking: Reported on 09/02/2020 06/27/19   Eldred Manges, MD  naproxen (NAPROSYN) 500 MG tablet Take 1 tablet (500 mg total) by mouth 2 (two) times daily. Patient not taking: Reported on 09/02/2020 06/22/19   Donnetta Hutching, MD  oxyCODONE-acetaminophen (PERCOCET) 5-325 MG tablet Take 1 tablet by mouth every 4 (four) hours as needed for moderate pain. Patient not taking: Reported on 06/27/2019 01/11/16   Dione Booze, MD  oxyCODONE-acetaminophen (PERCOCET) 5-325 MG tablet Take 1-2 tablets by mouth every 4 (four) hours as needed for moderate pain. Patient not taking: No sig reported 01/14/16   Jiles Harold, PA-C  predniSONE (DELTASONE) 10 MG tablet Take 2 tablets (20 mg total) by mouth daily. Patient not taking: Reported on 09/02/2020 06/22/19   Donnetta Hutching, MD    Allergies    Patient has no known allergies.  Review of Systems  Review of Systems  Constitutional: Negative for fever.  HENT: Negative for ear pain and sore throat.   Eyes: Positive for pain and redness.  Respiratory: Negative for cough.   Cardiovascular: Negative for chest pain.  Gastrointestinal: Negative for abdominal pain.  Genitourinary: Negative for flank pain.  Musculoskeletal: Negative for back pain.  Skin: Negative for color change and rash.  Neurological: Negative for syncope.  All other systems reviewed and are negative.   Physical Exam Updated Vital Signs BP (!) 143/93 (BP Location: Right Arm)   Pulse 78   Temp 97.7 F (36.5 C) (Oral)   Resp 18   Ht 5\' 11"   (1.803 m)   Wt 68 kg   SpO2 100%   BMI 20.92 kg/m   Physical Exam Constitutional:      General: He is not in acute distress.    Appearance: He is well-developed.  HENT:     Head: Normocephalic.     Nose: Nose normal.  Eyes:     Extraocular Movements: Extraocular movements intact.     Pupils: Pupils are equal, round, and reactive to light.     Comments: Left eye has moderate injection.  Conjunctiva appears mildly edematous in the lower medial aspect.  No foreign body noted.  No abrasions noted on slit-lamp exam.  Visual acuity is 20/30 on the right eye and 20/40 in the left eye.   Cardiovascular:     Rate and Rhythm: Normal rate.  Pulmonary:     Effort: Pulmonary effort is normal.  Skin:    Coloration: Skin is not jaundiced.  Neurological:     Mental Status: He is alert. Mental status is at baseline.     ED Results / Procedures / Treatments   Labs (all labs ordered are listed, but only abnormal results are displayed) Labs Reviewed - No data to display  EKG None  Radiology No results found.  Procedures Procedures   Medications Ordered in ED Medications  fluorescein ophthalmic strip 1 strip (has no administration in time range)  tetracaine (PONTOCAINE) 0.5 % ophthalmic solution 2 drop (has no administration in time range)    ED Course  I have reviewed the triage vital signs and the nursing notes.  Pertinent labs & imaging results that were available during my care of the patient were reviewed by me and considered in my medical decision making (see chart for details).    MDM Rules/Calculators/A&P                          Tetracaine applied to the eye and lid everted and swept with cotton applicator tip.  No foreign body noted.  Recommending outpatient follow-up with ophthalmology within the week.  Advising immediate return for worsening pain fevers vision change or any additional concerns.  Phone number provided to the on-call ophthalmologist for the  patient to call and make an appointment.   Final Clinical Impression(s) / ED Diagnoses Final diagnoses:  Sensation of foreign body in eye    Rx / DC Orders ED Discharge Orders         Ordered    trimethoprim-polymyxin b (POLYTRIM) ophthalmic solution  Every 4 hours        09/02/20 1031           09/04/20, MD 09/02/20 1032

## 2020-09-02 NOTE — Discharge Instructions (Addendum)
Use eyedrops as written.  Go to the ophthalmologist office tomorrow morning at 8:15 AM.  Return to the ER if you have any additional complications questions or any additional concerns.

## 2020-09-02 NOTE — ED Triage Notes (Signed)
Pt presents to ED with complaints of something in his left eye since Friday. Pt states he thinks it is saw dust. Pt c/o left eye irritation, redness and drainage.

## 2020-09-03 DIAGNOSIS — B0052 Herpesviral keratitis: Secondary | ICD-10-CM | POA: Diagnosis not present

## 2020-09-06 DIAGNOSIS — B0052 Herpesviral keratitis: Secondary | ICD-10-CM | POA: Diagnosis not present

## 2020-09-09 DIAGNOSIS — B0052 Herpesviral keratitis: Secondary | ICD-10-CM | POA: Diagnosis not present

## 2020-09-12 DIAGNOSIS — Z1331 Encounter for screening for depression: Secondary | ICD-10-CM | POA: Diagnosis not present

## 2020-09-12 DIAGNOSIS — Z6820 Body mass index (BMI) 20.0-20.9, adult: Secondary | ICD-10-CM | POA: Diagnosis not present

## 2020-09-12 DIAGNOSIS — I10 Essential (primary) hypertension: Secondary | ICD-10-CM | POA: Diagnosis not present

## 2020-09-12 DIAGNOSIS — B0052 Herpesviral keratitis: Secondary | ICD-10-CM | POA: Diagnosis not present

## 2020-09-20 DIAGNOSIS — B0052 Herpesviral keratitis: Secondary | ICD-10-CM | POA: Diagnosis not present

## 2020-11-19 DIAGNOSIS — G8929 Other chronic pain: Secondary | ICD-10-CM | POA: Diagnosis not present

## 2020-11-19 DIAGNOSIS — Z96649 Presence of unspecified artificial hip joint: Secondary | ICD-10-CM | POA: Diagnosis not present

## 2020-11-19 DIAGNOSIS — R69 Illness, unspecified: Secondary | ICD-10-CM | POA: Diagnosis not present

## 2020-11-19 DIAGNOSIS — I10 Essential (primary) hypertension: Secondary | ICD-10-CM | POA: Diagnosis not present

## 2020-11-19 DIAGNOSIS — M1990 Unspecified osteoarthritis, unspecified site: Secondary | ICD-10-CM | POA: Diagnosis not present

## 2020-11-19 DIAGNOSIS — Z008 Encounter for other general examination: Secondary | ICD-10-CM | POA: Diagnosis not present

## 2020-11-19 DIAGNOSIS — Z8249 Family history of ischemic heart disease and other diseases of the circulatory system: Secondary | ICD-10-CM | POA: Diagnosis not present

## 2021-01-26 ENCOUNTER — Emergency Department (HOSPITAL_COMMUNITY): Payer: Medicare HMO

## 2021-01-26 ENCOUNTER — Encounter (HOSPITAL_COMMUNITY): Payer: Self-pay | Admitting: Emergency Medicine

## 2021-01-26 ENCOUNTER — Other Ambulatory Visit: Payer: Self-pay

## 2021-01-26 ENCOUNTER — Emergency Department (HOSPITAL_COMMUNITY)
Admission: EM | Admit: 2021-01-26 | Discharge: 2021-01-26 | Discharge status: Against Medical Advice | Payer: Medicare HMO | Attending: Emergency Medicine | Admitting: Emergency Medicine

## 2021-01-26 DIAGNOSIS — I10 Essential (primary) hypertension: Secondary | ICD-10-CM | POA: Diagnosis not present

## 2021-01-26 DIAGNOSIS — W260XXA Contact with knife, initial encounter: Secondary | ICD-10-CM | POA: Insufficient documentation

## 2021-01-26 DIAGNOSIS — Z7982 Long term (current) use of aspirin: Secondary | ICD-10-CM | POA: Insufficient documentation

## 2021-01-26 DIAGNOSIS — Z79899 Other long term (current) drug therapy: Secondary | ICD-10-CM | POA: Insufficient documentation

## 2021-01-26 DIAGNOSIS — S61012A Laceration without foreign body of left thumb without damage to nail, initial encounter: Secondary | ICD-10-CM | POA: Diagnosis not present

## 2021-01-26 DIAGNOSIS — S61112A Laceration without foreign body of left thumb with damage to nail, initial encounter: Secondary | ICD-10-CM

## 2021-01-26 DIAGNOSIS — R69 Illness, unspecified: Secondary | ICD-10-CM | POA: Diagnosis not present

## 2021-01-26 DIAGNOSIS — F17210 Nicotine dependence, cigarettes, uncomplicated: Secondary | ICD-10-CM | POA: Insufficient documentation

## 2021-01-26 DIAGNOSIS — Z23 Encounter for immunization: Secondary | ICD-10-CM | POA: Diagnosis not present

## 2021-01-26 DIAGNOSIS — S6992XA Unspecified injury of left wrist, hand and finger(s), initial encounter: Secondary | ICD-10-CM | POA: Diagnosis present

## 2021-01-26 DIAGNOSIS — M7989 Other specified soft tissue disorders: Secondary | ICD-10-CM | POA: Diagnosis not present

## 2021-01-26 MED ORDER — TETANUS-DIPHTH-ACELL PERTUSSIS 5-2.5-18.5 LF-MCG/0.5 IM SUSY
0.5000 mL | PREFILLED_SYRINGE | Freq: Once | INTRAMUSCULAR | Status: AC
  Administered 2021-01-26: 0.5 mL via INTRAMUSCULAR
  Filled 2021-01-26: qty 0.5

## 2021-01-26 MED ORDER — POVIDONE-IODINE 10 % OINT PACKET: TOPICAL_OINTMENT | Freq: Once | CUTANEOUS | Status: AC

## 2021-01-26 MED ORDER — POVIDONE-IODINE 10 % EX SOLN
CUTANEOUS | Status: AC
  Filled 2021-01-26: qty 15

## 2021-01-26 NOTE — ED Provider Notes (Signed)
[facility] EMERGENCY DEPARTMENT Provider Note   CSN: [id number] Arrival date & time: 01/26/21  1211     History Chief Complaint  Patient presents with   Finger Injury    Cut left thumb with box cutter on yesterday.  Not sure when last T-dap was.  Rates pain 5/10.      Joel Salazar is a 50 y.o. male.  HPI  Patient with no significant medical history presents to the emergency department with chief complaint of laceration to his left thumb.  Patient states yesterday he was trying to cut a gas line with a box cutter unfortunately the knife went through the tubing and cut the distal end of his thumb.  He states he cut off the very distal part including the fingernail.  He was able to stop the bleeding with direct pressure.  He states he cleaned it out thoroughly and placed Neosporin on it and has wrapped it up.  He states he is here today because he is not up-to-date on his tetanus shot is concerned that it might be dirty. He denies any paresthesia or weakness in his thumb, he is not immunocompromise, does not take any medication that would lower his immune system.  He has no other complaints.  Does not endorse fevers, chills, chest pain, shortness of breath, abdominal pain   Past Medical History:  Diagnosis Date   Arthritis    Hypertension    Shortness of breath dyspnea    due to rib fractures    There are no problems to display for this patient.   Past Surgical History:  Procedure Laterality Date   ACETABULUM FRACTURE SURGERY Left    APPENDECTOMY     BACK SURGERY     Disectomy   FEMUR SURGERY Right    rod placed   ORIF CLAVICULAR FRACTURE Right 01/14/2016   Procedure: OPEN REDUCTION INTERNAL FIXATION (ORIF) CLAVICULAR FRACTURE;  Surgeon: Jones Broom, MD;  Location: [facility] OR;  Service: Orthopedics;  Laterality: Right;       History reviewed. No pertinent family history.  Social History   Tobacco Use   Smoking status: Every Day    Packs/day: 2.00    Years: 22.00     Pack years: 44.00    Types: Cigarettes   Smokeless tobacco: Never  Substance Use Topics   Alcohol use: Yes    Comment: 4 or 5 beers/night   Drug use: Not Currently    Types: Marijuana    Home Medications Prior to Admission medications   Medication Sig Start Date End Date Taking? Authorizing Provider  aspirin-acetaminophen-caffeine (EXCEDRIN MIGRAINE) [phone] MG tablet Take 1 tablet by mouth every 6 (six) hours as needed for headache.    [provider]  cyclobenzaprine (FLEXERIL) 10 MG tablet Take 1 tablet (10 mg total) by mouth 2 (two) times daily as needed. Patient not taking: Reported on 09/02/2020 06/22/19   Donnetta Hutching, MD  HYDROcodone-acetaminophen (NORCO/VICODIN) 5-325 MG tablet Take 1 tablet by mouth every 6 (six) hours as needed for moderate pain. Patient not taking: Reported on 09/02/2020 06/27/19   Eldred Manges, MD  lisinopril (ZESTRIL) 10 MG tablet Take 1 tablet by mouth daily. 05/23/20   [provider]  naproxen (NAPROSYN) 500 MG tablet Take 1 tablet (500 mg total) by mouth 2 (two) times daily. Patient not taking: Reported on 09/02/2020 06/22/19   Donnetta Hutching, MD  oxyCODONE-acetaminophen (PERCOCET) 5-325 MG tablet Take 1 tablet by mouth every 4 (four) hours as needed for moderate  pain. Patient not taking: Reported on 06/27/2019 01/11/16   Dione Booze, MD  oxyCODONE-acetaminophen (PERCOCET) 5-325 MG tablet Take 1-2 tablets by mouth every 4 (four) hours as needed for moderate pain. Patient not taking: No sig reported 01/14/16   Jiles Harold, PA-C  predniSONE (DELTASONE) 10 MG tablet Take 2 tablets (20 mg total) by mouth daily. Patient not taking: Reported on 09/02/2020 06/22/19   Donnetta Hutching, MD  trimethoprim-polymyxin b [facility]) ophthalmic solution Place 1 drop into the left eye every 4 (four) hours. 09/02/20   Cheryll Cockayne, MD    Allergies    Patient has no known allergies.  Review of Systems   Review of Systems  Constitutional:  Negative for chills  and fever.  HENT:  Negative for congestion.   Respiratory:  Negative for shortness of breath.   Cardiovascular:  Negative for chest pain.  Gastrointestinal:  Negative for abdominal pain.  Genitourinary:  Negative for enuresis.  Musculoskeletal:  Negative for back pain.  Skin:  Positive for wound. Negative for rash.       Laceration to left thumb.  Neurological:  Negative for dizziness.  Hematological:  Does not bruise/bleed easily.   Physical Exam Updated Vital Signs BP (!) 155/86 (BP Location: Right Arm)   Pulse 85   Temp 98.1 F (36.7 C) (Oral)   Resp 16   Ht 5\' 11"  (1.803 m)   Wt 65.8 kg   SpO2 100%   BMI 20.22 kg/m   Physical Exam Vitals and nursing note reviewed.  Constitutional:      General: He is not in acute distress.    Appearance: Normal appearance. He is not ill-appearing or diaphoretic.  HENT:     Head: Normocephalic and atraumatic.     Nose: No congestion or rhinorrhea.  Eyes:     Conjunctiva/sclera: Conjunctivae normal.  Cardiovascular:     Rate and Rhythm: Normal rate and regular rhythm.     Pulses: Normal pulses.  Pulmonary:     Effort: Pulmonary effort is normal.  Musculoskeletal:     Cervical back: Neck supple.     Right lower leg: No edema.     Left lower leg: No edema.     Comments: Patient's left thumb was visualized she has the distal end of his thumb cut off, including the fingernail, it was hemodynamically stable on my exam, no foreign body noted, no ligament tendon damage present, no signs of infection present, he has full range of motion in all joints of his thumb, neurovascular fully intact.   Please see picture for full detail.  Skin:    General: Skin is warm and dry.     Coloration: Skin is not jaundiced or pale.  Neurological:     Mental Status: He is alert and oriented to person, place, and time.  Psychiatric:        Mood and Affect: Mood normal.         ED Results / Procedures / Treatments   Labs (all labs ordered are  listed, but only abnormal results are displayed) Labs Reviewed - No data to display  EKG None  Radiology DG Finger Thumb Left  Result Date: 01/26/2021 CLINICAL DATA:  Laceration. EXAM: LEFT THUMB 2+V COMPARISON:  None. FINDINGS: There is no evidence of fracture or dislocation. Soft tissue defect at the tuft of the first left digit, query bone exposure. Associated soft tissue swelling. IMPRESSION: 1. No acute fracture or dislocation identified about the left first digit. 2. Soft tissue defect  at the tuft of the first left digit, query bone exposure. Electronically Signed   By: Ted Mcalpine M.D.   On: 01/26/2021 14:22    Procedures Procedures   Medications Ordered in ED Medications  povidone-iodine (BETADINE) 10 % ointment ( Topical Given 01/26/21 1339)  povidone-iodine (BETADINE) 10 % external solution (  Given 01/26/21 1339)  Tdap (BOOSTRIX) injection 0.5 mL (0.5 mLs Intramuscular Given 01/26/21 1415)    ED Course  I have reviewed the triage vital signs and the nursing notes.  Pertinent labs & imaging results that were available during my care of the patient were reviewed by me and considered in my medical decision making (see chart for details).    MDM Rules/Calculators/A&P                          Initial impression-patient presents with a laceration to his left thumb.  He is alert, does not appear to be in acute stress, vital signs reassuring.  Will obtain imaging for rule out of injury to bone, will washed with him thoroughly update tetanus shot and reassess.  Work-up-DG of left thumb reveals no acute fracture dislocations, shows soft tissue defect at the tuft of question of possible bone exposure  Reassessment- will soak the wound in normal saline Betadine, and thoroughly rinse it off.  Went over risks and benefits of laceration repair would recommend deferment as its been over 24 hours and risk of infection will greatly increase patient was agreement this plan.  Went to  update patient on imaging nursing staff informing that patient left, will mark him down as a elopement.  If he decides to come back be more than happy to reassess the patient.   Rule out- I have low suspicion for septic arthritis as patient denies IV drug use, skin exam was performed no erythematous, edematous, warm joints noted on exam, no new heart murmur heard on exam.  Low suspicion for fracture or dislocation as x-ray does not feel any significant findings. low suspicion for ligament or tendon damage as area was palpated no gross defects noted, they had full range of motion as well as 5/5 strength.  Low suspicion for compartment syndrome as area was palpated it was soft to the touch, neurovascular fully intact.   Final Clinical Impression(s) / ED Diagnoses Final diagnoses:  Laceration of left thumb without foreign body with damage to nail, initial encounter    Rx / DC Orders ED Discharge Orders     None        Carroll Sage, PA-C 01/26/21 1456    Horton, Clabe Seal, DO 01/26/21 1504

## 2021-03-19 DIAGNOSIS — Z23 Encounter for immunization: Secondary | ICD-10-CM | POA: Diagnosis not present

## 2021-03-19 DIAGNOSIS — Z681 Body mass index (BMI) 19 or less, adult: Secondary | ICD-10-CM | POA: Diagnosis not present

## 2021-03-19 DIAGNOSIS — M546 Pain in thoracic spine: Secondary | ICD-10-CM | POA: Diagnosis not present

## 2021-03-19 DIAGNOSIS — Z13220 Encounter for screening for lipoid disorders: Secondary | ICD-10-CM | POA: Diagnosis not present

## 2021-03-19 DIAGNOSIS — I10 Essential (primary) hypertension: Secondary | ICD-10-CM | POA: Diagnosis not present

## 2021-03-19 DIAGNOSIS — Z1331 Encounter for screening for depression: Secondary | ICD-10-CM | POA: Diagnosis not present

## 2021-03-19 DIAGNOSIS — Z0001 Encounter for general adult medical examination with abnormal findings: Secondary | ICD-10-CM | POA: Diagnosis not present

## 2021-07-08 ENCOUNTER — Inpatient Hospital Stay (HOSPITAL_COMMUNITY)
Admission: EM | Admit: 2021-07-08 | Discharge: 2021-07-11 | DRG: 200 | Disposition: A | Discharge status: Home / Self Care | Payer: Medicare HMO | Attending: Internal Medicine | Admitting: Internal Medicine

## 2021-07-08 ENCOUNTER — Other Ambulatory Visit: Payer: Self-pay

## 2021-07-08 ENCOUNTER — Encounter (HOSPITAL_COMMUNITY): Payer: Self-pay | Admitting: Emergency Medicine

## 2021-07-08 ENCOUNTER — Emergency Department (HOSPITAL_COMMUNITY): Payer: Medicare HMO

## 2021-07-08 DIAGNOSIS — I10 Essential (primary) hypertension: Secondary | ICD-10-CM | POA: Diagnosis present

## 2021-07-08 DIAGNOSIS — J9383 Other pneumothorax: Secondary | ICD-10-CM | POA: Diagnosis not present

## 2021-07-08 DIAGNOSIS — F419 Anxiety disorder, unspecified: Secondary | ICD-10-CM | POA: Diagnosis present

## 2021-07-08 DIAGNOSIS — G8929 Other chronic pain: Secondary | ICD-10-CM | POA: Diagnosis present

## 2021-07-08 DIAGNOSIS — J9311 Primary spontaneous pneumothorax: Secondary | ICD-10-CM | POA: Diagnosis not present

## 2021-07-08 DIAGNOSIS — R0789 Other chest pain: Secondary | ICD-10-CM | POA: Diagnosis not present

## 2021-07-08 DIAGNOSIS — J449 Chronic obstructive pulmonary disease, unspecified: Secondary | ICD-10-CM | POA: Diagnosis present

## 2021-07-08 DIAGNOSIS — J948 Other specified pleural conditions: Secondary | ICD-10-CM | POA: Diagnosis present

## 2021-07-08 DIAGNOSIS — R0602 Shortness of breath: Secondary | ICD-10-CM | POA: Diagnosis not present

## 2021-07-08 DIAGNOSIS — J432 Centrilobular emphysema: Secondary | ICD-10-CM | POA: Diagnosis present

## 2021-07-08 DIAGNOSIS — Z79899 Other long term (current) drug therapy: Secondary | ICD-10-CM

## 2021-07-08 DIAGNOSIS — Z720 Tobacco use: Secondary | ICD-10-CM | POA: Diagnosis present

## 2021-07-08 DIAGNOSIS — R059 Cough, unspecified: Secondary | ICD-10-CM | POA: Diagnosis not present

## 2021-07-08 DIAGNOSIS — F17210 Nicotine dependence, cigarettes, uncomplicated: Secondary | ICD-10-CM | POA: Diagnosis present

## 2021-07-08 DIAGNOSIS — J942 Hemothorax: Secondary | ICD-10-CM | POA: Diagnosis not present

## 2021-07-08 DIAGNOSIS — J9811 Atelectasis: Secondary | ICD-10-CM | POA: Diagnosis present

## 2021-07-08 DIAGNOSIS — J939 Pneumothorax, unspecified: Secondary | ICD-10-CM | POA: Diagnosis not present

## 2021-07-08 DIAGNOSIS — R079 Chest pain, unspecified: Secondary | ICD-10-CM | POA: Diagnosis not present

## 2021-07-08 DIAGNOSIS — J9312 Secondary spontaneous pneumothorax: Secondary | ICD-10-CM | POA: Diagnosis present

## 2021-07-08 LAB — CBC
HCT: 45.1 % (ref 39.0–52.0)
Hemoglobin: 15 g/dL (ref 13.0–17.0)
MCH: 31.8 pg (ref 26.0–34.0)
MCHC: 33.3 g/dL (ref 30.0–36.0)
MCV: 95.8 fL (ref 80.0–100.0)
Platelets: 271 10*3/uL (ref 150–400)
RBC: 4.71 MIL/uL (ref 4.22–5.81)
RDW: 12.4 % (ref 11.5–15.5)
WBC: 6.3 10*3/uL (ref 4.0–10.5)
nRBC: 0 % (ref 0.0–0.2)

## 2021-07-08 LAB — BASIC METABOLIC PANEL
Anion gap: 9 (ref 5–15)
BUN: 12 mg/dL (ref 6–20)
CO2: 28 mmol/L (ref 22–32)
Calcium: 9.4 mg/dL (ref 8.9–10.3)
Chloride: 101 mmol/L (ref 98–111)
Creatinine, Ser: 0.8 mg/dL (ref 0.61–1.24)
GFR, Estimated: >60 mL/min (ref >60)
Glucose, Bld: 99 mg/dL (ref 70–99)
Potassium: 4 mmol/L (ref 3.5–5.1)
Sodium: 138 mmol/L (ref 135–145)

## 2021-07-08 LAB — TROPONIN I (HIGH SENSITIVITY)
Troponin I (High Sensitivity): 3 ng/L (ref <18)
Troponin I (High Sensitivity): 3 ng/L (ref <18)

## 2021-07-08 LAB — D-DIMER, QUANTITATIVE: D-Dimer, Quant: 0.84 ug/mL-FEU — ABNORMAL HIGH (ref 0.00–0.50)

## 2021-07-08 MED ORDER — DM-GUAIFENESIN ER 30-600 MG PO TB12
2.0000 | ORAL_TABLET | Freq: Two times a day (BID) | ORAL | Status: DC
  Administered 2021-07-08 – 2021-07-11 (×5): 2 via ORAL
  Filled 2021-07-08: qty 2
  Filled 2021-07-08: qty 1
  Filled 2021-07-08 (×3): qty 2

## 2021-07-08 MED ORDER — LISINOPRIL 10 MG PO TABS
10.0000 mg | ORAL_TABLET | Freq: Every day | ORAL | Status: DC
Start: 2021-07-09 — End: 2021-07-08

## 2021-07-08 MED ORDER — ONDANSETRON HCL 4 MG/2ML IJ SOLN: 4.0000 mg | Freq: Four times a day (QID) | INTRAMUSCULAR | Status: DC | PRN

## 2021-07-08 MED ORDER — CYCLOBENZAPRINE HCL 10 MG PO TABS
10.0000 mg | ORAL_TABLET | Freq: Two times a day (BID) | ORAL | Status: DC | PRN
  Administered 2021-07-08: 10 mg via ORAL
  Filled 2021-07-08: qty 1

## 2021-07-08 MED ORDER — ASPIRIN 81 MG PO CHEW
324.0000 mg | CHEWABLE_TABLET | Freq: Once | ORAL | Status: AC
  Administered 2021-07-08: 324 mg via ORAL
  Filled 2021-07-08: qty 4

## 2021-07-08 MED ORDER — ACETAMINOPHEN 325 MG PO TABS: 650.0000 mg | ORAL_TABLET | Freq: Four times a day (QID) | ORAL | Status: DC | PRN

## 2021-07-08 MED ORDER — LISINOPRIL 10 MG PO TABS: 10.0000 mg | ORAL_TABLET | Freq: Every day | ORAL | Status: DC

## 2021-07-08 MED ORDER — CLONIDINE HCL 0.1 MG PO TABS
0.1000 mg | ORAL_TABLET | Freq: Two times a day (BID) | ORAL | Status: DC
Start: 2021-07-08 — End: 2021-07-11
  Administered 2021-07-09 – 2021-07-11 (×3): 0.1 mg via ORAL
  Filled 2021-07-08 (×5): qty 1

## 2021-07-08 MED ORDER — NICOTINE 21 MG/24HR TD PT24
21.0000 mg | MEDICATED_PATCH | Freq: Every day | TRANSDERMAL | Status: DC
  Administered 2021-07-08 – 2021-07-11 (×4): 21 mg via TRANSDERMAL
  Filled 2021-07-08 (×5): qty 1

## 2021-07-08 MED ORDER — KETOROLAC TROMETHAMINE 30 MG/ML IJ SOLN
30.0000 mg | Freq: Four times a day (QID) | INTRAMUSCULAR | Status: DC | PRN
  Administered 2021-07-08: 30 mg via INTRAVENOUS
  Filled 2021-07-08: qty 1

## 2021-07-08 MED ORDER — ACETAMINOPHEN 650 MG RE SUPP: 650.0000 mg | Freq: Four times a day (QID) | RECTAL | Status: DC | PRN

## 2021-07-08 MED ORDER — NITROGLYCERIN 0.4 MG SL SUBL
0.4000 mg | SUBLINGUAL_TABLET | SUBLINGUAL | Status: DC | PRN
  Administered 2021-07-08 (×2): 0.4 mg via SUBLINGUAL
  Filled 2021-07-08: qty 1

## 2021-07-08 MED ORDER — MOMETASONE FURO-FORMOTEROL FUM 100-5 MCG/ACT IN AERO
2.0000 | INHALATION_SPRAY | Freq: Two times a day (BID) | RESPIRATORY_TRACT | Status: DC
  Administered 2021-07-08 – 2021-07-11 (×6): 2 via RESPIRATORY_TRACT
  Filled 2021-07-08: qty 8.8

## 2021-07-08 MED ORDER — ALBUTEROL SULFATE (2.5 MG/3ML) 0.083% IN NEBU
2.5000 mg | INHALATION_SOLUTION | RESPIRATORY_TRACT | Status: DC | PRN
  Administered 2021-07-08: 2.5 mg via RESPIRATORY_TRACT
  Filled 2021-07-08: qty 3

## 2021-07-08 MED ORDER — ONDANSETRON HCL 4 MG PO TABS: 4.0000 mg | ORAL_TABLET | Freq: Four times a day (QID) | ORAL | Status: DC | PRN

## 2021-07-08 MED ORDER — SODIUM CHLORIDE 0.9 % IV BOLUS
1000.0000 mL | Freq: Once | INTRAVENOUS | Status: AC
  Administered 2021-07-08: 1000 mL via INTRAVENOUS

## 2021-07-08 NOTE — Assessment & Plan Note (Addendum)
Follow-up with Dr. Melvyn Novas as noted 3/27 ?No need for oxygen on discharge ?Prescribed antitussives to continue ?

## 2021-07-08 NOTE — Assessment & Plan Note (Signed)
Counseled on the importance of tobacco cessation ?We will provide nicotine patch ?

## 2021-07-08 NOTE — H&P (Signed)
?History and Physical  ? ? ?Patient: Joel Salazar IOM:[id number] DOB: 08/22/1970 ?DOA: 07/08/2021 ?DOS: the patient was seen and examined on 07/08/2021 ?PCP: [facility]  ?Patient coming from: Home ? ?Chief Complaint:  ?Chief Complaint  ?Patient presents with  ? Chest Pain  ? ?HPI: Joel Salazar is a 51 y.o. male with medical history significant of hypertension, arthritis, chronic tobacco use, reports that he began noticing dyspnea on exertion and central chest pressure for the past 2 days.  Symptoms may have started after a prolonged episode of coughing, although he is not completely sure.  He has not had any fever.  He has a chronic productive cough which he attributes to his smoking.  Sputum is clear and has not changed.  He has not had any vomiting, diarrhea.  He has noticed his dyspnea is worse on exertion.  He is unable to do his usual activities.  When his symptoms persisted, he contacted his primary care physician's office today was directed to come to the ER for evaluation ? ?Review of Systems: As mentioned in the history of present illness. All other systems reviewed and are negative. ?Past Medical History:  ?Diagnosis Date  ? Arthritis   ? Hypertension   ? Shortness of breath dyspnea   ? due to rib fractures  ? ?Past Surgical History:  ?Procedure Laterality Date  ? ACETABULUM FRACTURE SURGERY Left   ? APPENDECTOMY    ? BACK SURGERY    ? Disectomy  ? FEMUR SURGERY Right   ? rod placed  ? ORIF CLAVICULAR FRACTURE Right 01/14/2016  ? Procedure: OPEN REDUCTION INTERNAL FIXATION (ORIF) CLAVICULAR FRACTURE;  Surgeon: Jones Broom, MD;  Location: [facility] OR;  Service: Orthopedics;  Laterality: Right;  ? ?Social History:  reports that he has been smoking cigarettes. He has a 44.00 pack-year smoking history. He has never used smokeless tobacco. He reports current alcohol use. He reports that he does not currently use drugs after having used the following drugs: Marijuana. Frequency: 3.00  times per week. ? ?No Known Allergies ? ?History reviewed. No pertinent family history. ? ?Prior to Admission medications   ?Medication Sig Start Date End Date Taking? Authorizing Provider  ?cyclobenzaprine (FLEXERIL) 10 MG tablet Take 1 tablet (10 mg total) by mouth 2 (two) times daily as needed. ?Patient taking differently: Take 10 mg by mouth 2 (two) times daily as needed for muscle spasms. 06/22/19  Yes Donnetta Hutching, MD  ?lisinopril (ZESTRIL) 10 MG tablet Take 1 tablet by mouth daily. 05/23/20  Yes [provider]  ?HYDROcodone-acetaminophen (NORCO/VICODIN) 5-325 MG tablet Take 1 tablet by mouth every 6 (six) hours as needed for moderate pain. ?Patient not taking: Reported on 09/02/2020 06/27/19   Eldred Manges, MD  ?naproxen (NAPROSYN) 500 MG tablet Take 1 tablet (500 mg total) by mouth 2 (two) times daily. ?Patient not taking: Reported on 09/02/2020 06/22/19   Donnetta Hutching, MD  ?oxyCODONE-acetaminophen (PERCOCET) 5-325 MG tablet Take 1 tablet by mouth every 4 (four) hours as needed for moderate pain. ?Patient not taking: Reported on 06/27/2019 01/11/16   Dione Booze, MD  ?oxyCODONE-acetaminophen (PERCOCET) 5-325 MG tablet Take 1-2 tablets by mouth every 4 (four) hours as needed for moderate pain. ?Patient not taking: Reported on 06/27/2019 01/14/16   Jiles Harold, PA-C  ?predniSONE (DELTASONE) 10 MG tablet Take 2 tablets (20 mg total) by mouth daily. ?Patient not taking: Reported on 09/02/2020 06/22/19   Donnetta Hutching, MD  ?trimethoprim-polymyxin b [facility]) ophthalmic solution Place 1  drop into the left eye every 4 (four) hours. ?Patient not taking: Reported on 07/08/2021 09/02/20   Cheryll Cockayne, MD  ? ? ?Physical Exam: ?Vitals:  ? 07/08/21 1130 07/08/21 1200 07/08/21 1230 07/08/21 1323  ?BP: 140/90 (!) 143/91 (!) 139/92 (!) 144/99  ?Pulse: 66 88 71 84  ?Resp: 18 20 (!) 22 (!) 29  ?Temp:      ?TempSrc:      ?SpO2: 98% 96% 98% 100%  ?Weight:      ?Height:      ? ?General exam: Alert, awake, oriented x  3 ?Respiratory system: Diminished breath sounds at right base. Respiratory effort normal. ?Cardiovascular system:RRR. No murmurs, rubs, gallops. ?Gastrointestinal system: Abdomen is nondistended, soft and nontender. No organomegaly or masses felt. Normal bowel sounds heard. ?Central nervous system: Alert and oriented. No focal neurological deficits. ?Extremities: No C/C/E, +pedal pulses ?Skin: No rashes, lesions or ulcers ?Psychiatry: Judgement and insight appear normal. Mood & affect appropriate.  ? ?Data Reviewed: ? ?Reviewed chest x-ray that shows right lower pneumothorax, CBC and chemistry are unremarkable.  D-dimer mildly elevated. ? ?Assessment and Plan: ?* Pneumothorax ?Initial chest x-ray indicates moderate pneumothorax ?Patient reports that this may have began after an episode of coughing ?Currently, he is on room air, able to complete full sentences and does not appear to be in any distress ?He says that the majority of his dyspnea is on exertion ?Pulmonology has been consulted and will see patient ?Per EDP, after discussing with pulmonology this morning, holding off on chest tube for now. ? ?Essential hypertension ?Chronically on lisinopril, will continue for now ? ?COPD (chronic obstructive pulmonary disease) (HCC) ?He does not have a formal diagnosis of COPD, but has a long history of tobacco use ?He has smoked 1-1/2 packs/day for many years ?Noted to have hyperinflation on chest x-ray ?Will use albuterol as needed for wheezing ? ?Tobacco abuse ?Counseled on the importance of tobacco cessation ?We will provide nicotine patch ? ? ? ? ? Advance Care Planning:   Code Status: Full Code  ? ?Consults: pulmonology ? ?Family Communication: updated patient's girlfriend Patience Musca ? ?Severity of Illness: ?The appropriate patient status for this patient is OBSERVATION. Observation status is judged to be reasonable and necessary in order to provide the required intensity of service to ensure the patient's safety.  The patient's presenting symptoms, physical exam findings, and initial radiographic and laboratory data in the context of their medical condition is felt to place them at decreased risk for further clinical deterioration. Furthermore, it is anticipated that the patient will be medically stable for discharge from the hospital within 2 midnights of admission.  ? ?Author: ?Erick Blinks, MD ?07/08/2021 2:06 PM ? ?For on call review [email or web address].  ?

## 2021-07-08 NOTE — ED Notes (Signed)
Patient transported to X-ray 

## 2021-07-08 NOTE — Assessment & Plan Note (Addendum)
He does not have a formal diagnosis of COPD, but has a long history of tobacco use ?He has smoked 1-1/2 packs/day for many years ?Noted to have hyperinflation on chest x-ray ?Will use albuterol as needed for wheezing ?Advised on smoking cessation and will continue on Dulera and follow-up with pulmonary ?

## 2021-07-08 NOTE — Assessment & Plan Note (Addendum)
Avoid use of lisinopril and remain on clonidine as prescribed ?

## 2021-07-08 NOTE — ED Provider Notes (Addendum)
?[facility] EMERGENCY DEPARTMENT ?Provider Note ? ? ?CSN: [id number] ?Arrival date & time: 07/08/21  0744 ? ?  ? ?History ? ?Chief Complaint  ?Patient presents with  ? Chest Pain  ? ? ?Genevie CheshireBilly Ray Maisie Fushomas is a 51 y.o. male. ? ?HPI ?51 year old male presents with chest pain.  Started 2 evenings ago while he was watching basketball.  Feels like a tightness/pressure in the middle of his chest.  He is also beginning short of breath.  He has a chronic cough and that makes it a little worse and is worse with deep inspiration.  No recent travel or leg swelling.  No history of DVT.  He states that he tried to help a friend out yesterday but was getting short of breath with activities which is not typical for him.  He is an active smoker and has a history of hypertension.  He denies family history of cardiac disease.  The symptoms seem to come and go, often with exertion but other times randomly.  Right now the discomfort is about a 4 or 5 out of 10. ? ?Home Medications ?Prior to Admission medications   ?Medication Sig Start Date End Date Taking? Authorizing Provider  ?aspirin-acetaminophen-caffeine (EXCEDRIN MIGRAINE) 250-250-65 MG tablet Take 1 tablet by mouth every 6 (six) hours as needed for headache.    [provider]  ?cyclobenzaprine (FLEXERIL) 10 MG tablet Take 1 tablet (10 mg total) by mouth 2 (two) times daily as needed. ?Patient not taking: Reported on 09/02/2020 06/22/19   Donnetta Hutchingook, Brian, MD  ?HYDROcodone-acetaminophen (NORCO/VICODIN) 5-325 MG tablet Take 1 tablet by mouth every 6 (six) hours as needed for moderate pain. ?Patient not taking: Reported on 09/02/2020 06/27/19   Eldred MangesYates, Mark C, MD  ?lisinopril (ZESTRIL) 10 MG tablet Take 1 tablet by mouth daily. 05/23/20   [provider]  ?naproxen (NAPROSYN) 500 MG tablet Take 1 tablet (500 mg total) by mouth 2 (two) times daily. ?Patient not taking: Reported on 09/02/2020 06/22/19   Donnetta Hutchingook, Brian, MD  ?oxyCODONE-acetaminophen (PERCOCET) 5-325 MG tablet Take 1 tablet  by mouth every 4 (four) hours as needed for moderate pain. ?Patient not taking: Reported on 06/27/2019 01/11/16   Dione BoozeGlick, David, MD  ?oxyCODONE-acetaminophen (PERCOCET) 5-325 MG tablet Take 1-2 tablets by mouth every 4 (four) hours as needed for moderate pain. ?Patient not taking: No sig reported 01/14/16   Jiles HaroldLaliberte, Danielle, PA-C  ?predniSONE (DELTASONE) 10 MG tablet Take 2 tablets (20 mg total) by mouth daily. ?Patient not taking: Reported on 09/02/2020 06/22/19   Donnetta Hutchingook, Brian, MD  ?trimethoprim-polymyxin b [facility](POLYTRIM) ophthalmic solution Place 1 drop into the left eye every 4 (four) hours. 09/02/20   Cheryll CockayneHong, Joshua S, MD  ?   ? ?Allergies    ?Patient has no known allergies.   ? ?Review of Systems   ?Review of Systems  ?Respiratory:  Positive for cough, chest tightness and shortness of breath.   ?Cardiovascular:  Positive for chest pain. Negative for leg swelling.  ? ?Physical Exam ?Updated Vital Signs ?BP (!) 142/84   Pulse 92   Temp 97.8 ?F (36.6 ?C) (Oral)   Resp 20   Ht 5\' 11"  (1.803 m)   Wt 65.8 kg   SpO2 94%   BMI 20.23 kg/m?  ?Physical Exam ?Vitals and nursing note reviewed.  ?Constitutional:   ?   Appearance: He is well-developed.  ?HENT:  ?   Head: Normocephalic and atraumatic.  ?Cardiovascular:  ?   Rate and Rhythm: Normal rate and regular rhythm.  ?  Heart sounds: Normal heart sounds.  ?Pulmonary:  ?   Effort: Pulmonary effort is normal.  ?   Breath sounds: Wheezing (slight) present.  ?Chest:  ?   Chest wall: No tenderness.  ?Abdominal:  ?   Palpations: Abdomen is soft.  ?   Tenderness: There is no abdominal tenderness.  ?Musculoskeletal:  ?   Right lower leg: No tenderness. No edema.  ?   Left lower leg: No tenderness. No edema.  ?Skin: ?   General: Skin is warm and dry.  ?Neurological:  ?   Mental Status: He is alert.  ? ? ?ED Results / Procedures / Treatments   ?Labs ?(all labs ordered are listed, but only abnormal results are displayed) ?Labs Reviewed  ?D-DIMER, QUANTITATIVE - Abnormal; Notable for the  following components:  ?    Result Value  ? D-Dimer, Quant 0.84 (*)   ? All other components within normal limits  ?BASIC METABOLIC PANEL  ?CBC  ?TROPONIN I (HIGH SENSITIVITY)  ? ? ?EKG ?EKG Interpretation ? ?Date/Time:  Tuesday July 08 2021 07:56:13 EDT ?Ventricular Rate:  89 ?PR Interval:  134 ?QRS Duration: 77 ?QT Interval:  375 ?QTC Calculation: 457 ?R Axis:   85 ?Text Interpretation: Sinus rhythm RSR' in V1 or V2, probably normal variant Nonspecific T abnormalities, lateral leads ST elev, probable normal early repol pattern no significant change since 2009 Confirmed by Pricilla Loveless [id number]) on 07/08/2021 7:58:18 AM ? ?Radiology ?DG Chest 2 View ? ?Result Date: 07/08/2021 ?CLINICAL DATA:  Chest pain with shortness of breath and cough EXAM: CHEST - 2 VIEW COMPARISON:  01/11/2016 FINDINGS: Moderate sized right pneumothorax with a right base pleural fluid level compatible with a hydropneumothorax. Right apical bullous disease noted. Mild hyperinflation suspect background COPD/emphysema. Normal heart size and vascularity. Trachea midline. Previous right clavicle ORIF. No acute osseous finding. IMPRESSION: Moderate-sized right hydropneumothorax, suspect related to right apex bullous disease. These results were called by telephone at the time of interpretation on 07/08/2021 at 8:27 am to provider Pricilla Loveless , who verbally acknowledged these results. Electronically Signed   By: Judie Petit.  Shick M.D.   On: 07/08/2021 08:28   ? ?Procedures ?Procedures  ? ? ?Medications Ordered in ED ?Medications  ?aspirin chewable tablet 324 mg (324 mg Oral Given 07/08/21 0821)  ?sodium chloride 0.9 % bolus 1,000 mL (1,000 mLs Intravenous New Bag/Given 07/08/21 0821)  ? ? ?ED Course/ Medical Decision Making/ A&P ?  ?HEAR Score: 4                       ?Medical Decision Making ?Problems Addressed: ?Hydropneumothorax: acute illness or injury that poses a threat to life or bodily functions ? ?Amount and/or Complexity of Data Reviewed ?Labs:  ordered. ?Radiology: ordered and independent interpretation performed. ?ECG/medicine tests: ordered and independent interpretation performed. ? ?Risk ?OTC drugs. ?Prescription drug management. ?Decision regarding hospitalization. ? ? ?Patient's work-up shows right-sided hydropneumothorax.  He is quite stable otherwise with some mild hypertension but no shortness of breath/increased work of breathing or hypoxia.  Likely a ruptured bleb.  I personally reviewed/interpreted these chest x-ray images and discussed with radiology.  I consulted pulmonology, Dr. Sherene Sires, and we have reviewed the images together.  Given the size and how long has been ongoing, he agrees and feels chest tube is not necessary at this point.  Would favor observation on oxygen.  Patient is okay with this plan as well.  Labs were obtained and while originally he had some pleuritic  pain and a transient heart rate of 100, a D-dimer was sent and is slightly elevated.  However my suspicion is pretty low that he also has a concomitant PE.  Pulmonology does not feel a CT is needed from the pneumothorax perspective.  Pain seems to be gone after he was given some aspirin and nitro, though this now seems less likely to be cardiac.  Discussed with Dr. Kerry Hough for admission. ? ? ? ? ? ? ? ?Final Clinical Impression(s) / ED Diagnoses ?Final diagnoses:  ?Hydropneumothorax  ? ? ?Rx / DC Orders ?ED Discharge Orders   ? ? None  ? ?  ? ? ?  ?Pricilla Loveless, MD ?07/08/21 8366 ? ?  ?Pricilla Loveless, MD ?07/08/21 [phone] ? ?

## 2021-07-08 NOTE — Progress Notes (Signed)
Full note to follow - he's had a "cold" for about a week with typical smoker's bronchitis that probably precipitated small R ptx that can be treated with 02/cough suppression/ analgesics prn  ? ?Will recheck cxr in am and formal consult then for long term f/u. ? ?Sandrea Hughs, MD ?Pulmonary and Critical Care Medicine ?[facility] ?Cell [phone]  ? ?After 7:00 pm call Elink  [phone]  ?

## 2021-07-08 NOTE — ED Triage Notes (Signed)
Pt to the ED with complaints of chest pain described as pressure since Saturday. ? ?Pt states the pain is worse when he coughs and has gotten progressively worse. ?

## 2021-07-09 ENCOUNTER — Observation Stay (HOSPITAL_COMMUNITY): Payer: Medicare HMO

## 2021-07-09 DIAGNOSIS — F17210 Nicotine dependence, cigarettes, uncomplicated: Secondary | ICD-10-CM | POA: Diagnosis present

## 2021-07-09 DIAGNOSIS — J939 Pneumothorax, unspecified: Secondary | ICD-10-CM | POA: Diagnosis not present

## 2021-07-09 DIAGNOSIS — J9383 Other pneumothorax: Secondary | ICD-10-CM | POA: Diagnosis not present

## 2021-07-09 DIAGNOSIS — J439 Emphysema, unspecified: Secondary | ICD-10-CM | POA: Diagnosis not present

## 2021-07-09 DIAGNOSIS — Z79899 Other long term (current) drug therapy: Secondary | ICD-10-CM | POA: Diagnosis not present

## 2021-07-09 DIAGNOSIS — G8929 Other chronic pain: Secondary | ICD-10-CM | POA: Diagnosis not present

## 2021-07-09 DIAGNOSIS — J90 Pleural effusion, not elsewhere classified: Secondary | ICD-10-CM | POA: Diagnosis not present

## 2021-07-09 DIAGNOSIS — J9311 Primary spontaneous pneumothorax: Secondary | ICD-10-CM | POA: Diagnosis not present

## 2021-07-09 DIAGNOSIS — I10 Essential (primary) hypertension: Secondary | ICD-10-CM | POA: Diagnosis not present

## 2021-07-09 DIAGNOSIS — J449 Chronic obstructive pulmonary disease, unspecified: Secondary | ICD-10-CM | POA: Diagnosis not present

## 2021-07-09 DIAGNOSIS — J9811 Atelectasis: Secondary | ICD-10-CM | POA: Diagnosis not present

## 2021-07-09 DIAGNOSIS — F419 Anxiety disorder, unspecified: Secondary | ICD-10-CM | POA: Diagnosis present

## 2021-07-09 DIAGNOSIS — J181 Lobar pneumonia, unspecified organism: Secondary | ICD-10-CM | POA: Diagnosis not present

## 2021-07-09 DIAGNOSIS — J948 Other specified pleural conditions: Secondary | ICD-10-CM | POA: Diagnosis not present

## 2021-07-09 DIAGNOSIS — R69 Illness, unspecified: Secondary | ICD-10-CM | POA: Diagnosis not present

## 2021-07-09 LAB — PROTIME-INR
INR: 1 (ref 0.8–1.2)
Prothrombin Time: 13.2 seconds (ref 11.4–15.2)

## 2021-07-09 MED ORDER — ENOXAPARIN SODIUM 40 MG/0.4ML IJ SOSY: 40.0000 mg | PREFILLED_SYRINGE | INTRAMUSCULAR | Status: DC

## 2021-07-09 MED ORDER — HYDRALAZINE HCL 20 MG/ML IJ SOLN: 10.0000 mg | INTRAMUSCULAR | Status: DC | PRN

## 2021-07-09 NOTE — Progress Notes (Signed)
PROGRESS NOTE    Joel Salazar  ZOX:[id number] DOB: May 24, 1970 DOA: 07/08/2021 PCP: [facility]   Brief Narrative:  Per HPI: Joel Salazar is a 51 y.o. male with medical history significant of hypertension, arthritis, chronic tobacco use, reports that he began noticing dyspnea on exertion and central chest pressure for the past 2 days.  Symptoms may have started after a prolonged episode of coughing, although he is not completely sure.  He has not had any fever.  He has a chronic productive cough which he attributes to his smoking.  Sputum is clear and has not changed.  He has not had any vomiting, diarrhea.  He has noticed his dyspnea is worse on exertion.  He is unable to do his usual activities.  When his symptoms persisted, he contacted his primary care physician's office today was directed to come to the ER for evaluation  07/09/21: Patient was admitted with moderate sized right-sided pneumothorax in the setting of COPD with ongoing tobacco abuse.  He continues to have significant findings on CT scan.  Pulmonology recommending initiation of 6 L nasal cannula oxygen.    Assessment & Plan:   Principal Problem:   Pneumothorax Active Problems:   Tobacco abuse   COPD (chronic obstructive pulmonary disease) (HCC)   Essential hypertension  Assessment and Plan: * Pneumothorax Initial chest x-ray indicates moderate pneumothorax Patient reports that this may have began after an episode of coughing -He has been placed on 6 L nasal cannula oxygen He says that the majority of his dyspnea is on exertion -Appreciate pulmonology evaluation, ongoing.  Essential hypertension Lisinopril held for now Patient states that he does not use clonidine at home As needed IV medications as needed  COPD (chronic obstructive pulmonary disease) (HCC) He does not have a formal diagnosis of COPD, but has a long history of tobacco use He has smoked 1-1/2 packs/day for many  years Noted to have hyperinflation on chest x-ray Will use albuterol as needed for wheezing  Tobacco abuse Counseled on the importance of tobacco cessation We will provide nicotine patch    DVT prophylaxis:Lovenox Code Status: Full Family Communication: None at bedside Disposition Plan:  Status is: Observation The patient will require care spanning > 2 midnights and should be moved to inpatient because: Need for close monitoring.   Consultants:  PCCM  Procedures:  See below  Antimicrobials:  None   Subjective: Patient seen and evaluated today with no new acute complaints or concerns.  He denies any cough, shortness of breath, or chest pain.  No acute concerns or events noted overnight.  Objective: Vitals:   07/09/21 0754 07/09/21 0850 07/09/21 1148 07/09/21 1203  BP:  120/72  (!) 140/91  Pulse:   71 70  Resp:   17 18  Temp:    97.7 F (36.5 C)  TempSrc:    Oral  SpO2: 99%  100% 100%  Weight:      Height:        Intake/Output Summary (Last 24 hours) at 07/09/2021 1214 Last data filed at 07/09/2021 1159 Gross per 24 hour  Intake 711 ml  Output --  Net 711 ml   Filed Weights   07/08/21 0754 07/08/21 1828  Weight: 65.8 kg 65 kg    Examination:  General exam: Appears calm and comfortable  Respiratory system: Diminished to auscultation. Respiratory effort normal.  Currently on nasal cannula oxygen Cardiovascular system: S1 & S2 heard, RRR.  Gastrointestinal system: Abdomen is soft Central nervous  system: Alert and awake Extremities: No edema Skin: No significant lesions noted Psychiatry: Flat affect.    Data Reviewed: I have personally reviewed following labs and imaging studies  CBC: Recent Labs  Lab 07/08/21 0757  WBC 6.3  HGB 15.0  HCT 45.1  MCV 95.8  PLT 271   Basic Metabolic Panel: Recent Labs  Lab 07/08/21 0757  NA 138  K 4.0  CL 101  CO2 28  GLUCOSE 99  BUN 12  CREATININE 0.80  CALCIUM 9.4   GFR: Estimated Creatinine  Clearance: 103.8 mL/min (by C-G formula based on SCr of 0.8 mg/dL). Liver Function Tests: No results for input(s): AST, ALT, ALKPHOS, BILITOT, PROT, ALBUMIN in the last 168 hours. No results for input(s): LIPASE, AMYLASE in the last 168 hours. No results for input(s): AMMONIA in the last 168 hours. Coagulation Profile: No results for input(s): INR, PROTIME in the last 168 hours. Cardiac Enzymes: No results for input(s): CKTOTAL, CKMB, CKMBINDEX, TROPONINI in the last 168 hours. BNP (last 3 results) No results for input(s): PROBNP in the last 8760 hours. HbA1C: No results for input(s): HGBA1C in the last 72 hours. CBG: No results for input(s): GLUCAP in the last 168 hours. Lipid Profile: No results for input(s): CHOL, HDL, LDLCALC, TRIG, CHOLHDL, LDLDIRECT in the last 72 hours. Thyroid Function Tests: No results for input(s): TSH, T4TOTAL, FREET4, T3FREE, THYROIDAB in the last 72 hours. Anemia Panel: No results for input(s): VITAMINB12, FOLATE, FERRITIN, TIBC, IRON, RETICCTPCT in the last 72 hours. Sepsis Labs: No results for input(s): PROCALCITON, LATICACIDVEN in the last 168 hours.  No results found for this or any previous visit (from the past 240 hour(s)).       Radiology Studies: DG Chest 2 View  Result Date: 07/09/2021 CLINICAL DATA:  Right pneumothorax. EXAM: CHEST - 2 VIEW COMPARISON:  07/08/2021 and CT chest 01/13/2009. FINDINGS: Trachea is midline. Heart size normal. Lungs are hyperinflated with biapical bullous emphysema. Moderate right pneumothorax and small right pleural effusion appear similar to minimally improved. Right basilar subsegmental atelectasis. Left lung is clear. No left pleural fluid. Plate and screw fixation of the right clavicle. IMPRESSION: 1. Moderate right hydropneumothorax, possibly minimally improved from 07/08/2021. 2. Right basilar subsegmental atelectasis. Electronically Signed   By: Leanna Battles M.D.   On: 07/09/2021 09:45   DG Chest 2  View  Result Date: 07/08/2021 CLINICAL DATA:  Chest pain with shortness of breath and cough EXAM: CHEST - 2 VIEW COMPARISON:  01/11/2016 FINDINGS: Moderate sized right pneumothorax with a right base pleural fluid level compatible with a hydropneumothorax. Right apical bullous disease noted. Mild hyperinflation suspect background COPD/emphysema. Normal heart size and vascularity. Trachea midline. Previous right clavicle ORIF. No acute osseous finding. IMPRESSION: Moderate-sized right hydropneumothorax, suspect related to right apex bullous disease. These results were called by telephone at the time of interpretation on 07/08/2021 at 8:27 am to provider Pricilla Loveless , who verbally acknowledged these results. Electronically Signed   By: Judie Petit.  Shick M.D.   On: 07/08/2021 08:28   CT CHEST WO CONTRAST  Result Date: 07/09/2021 CLINICAL DATA:  Pneumothorax EXAM: CT CHEST WITHOUT CONTRAST TECHNIQUE: Multidetector CT imaging of the chest was performed following the standard protocol without IV contrast. RADIATION DOSE REDUCTION: This exam was performed according to the departmental dose-optimization program which includes automated exposure control, adjustment of the mA and/or kV according to patient size and/or use of iterative reconstruction technique. COMPARISON:  Same day chest radiograph FINDINGS: Cardiovascular: Normal heart size. No pericardial  effusion. No significant coronary artery calcifications or atherosclerotic disease of the thoracic aorta. Mediastinum/Nodes: Esophagus and thyroid are unremarkable. No pathologically enlarged lymph nodes seen in the chest. Lungs/Pleura: Central airways are patent. Centrilobular emphysema with apical blebs. Moderate right pneumothorax. Linear opacity and consolidation of the right lower lobe with associated bronchial wall thickening. Upper Abdomen: No acute abnormality. Musculoskeletal: Chronic osseous deformity of the lateral right ribs. No aggressive osseous lesions.  IMPRESSION: 1. Moderate right pneumothorax, likely secondary to ruptured apical bleb. 2. Linear opacity and consolidation of the right lower lobe with associated bronchial wall thickening, likely due to infection or aspiration. 3. Emphysema (ICD10-J43.9). Electronically Signed   By: Allegra Lai M.D.   On: 07/09/2021 10:53        Scheduled Meds:  cloNIDine  0.1 mg Oral BID   dextromethorphan-guaiFENesin  2 tablet Oral BID   mometasone-formoterol  2 puff Inhalation BID   nicotine  21 mg Transdermal Daily     LOS: 0 days    Time spent: 35 minutes    Ladon Heney Hoover Brunette, DO [facility]  If 7PM-7AM, please contact night-coverage [email or web address] 07/09/2021, 12:14 PM

## 2021-07-09 NOTE — Consult Note (Signed)
? ?NAME:  Joel Salazar, MRN:  [id number], DOB:  [birth year], LOS: 0 ?ADMISSION DATE:  07/08/2021, CONSULTATION DATE:  07/09/21 ?REFERRING MD:  Shah/[facility], CHIEF COMPLAINT:  R cp  ? ?History of Present Illness:  ?46 yowm smoker on ACEi with cough x 1 week PTA mucoid then abrupt R CP 3/19 and in ER 3/21 with Small R PTX and PCCM service asked to evaluate.  Pt denies sob but chronically limited by multiple orthopedic issues previously related to separate traumatic events  ? ?Pertinent  Medical History  ?HBP on ACEi ?Arthritis/ chronic pain s/p multiple orthopedic surgeries  ? ?Significant Hospital Events: ?Including procedures, antibiotic start and stop dates in addition to other pertinent events   ?CT cehst 3/22 >>> ? ?Scheduled Meds: ? cloNIDine  0.1 mg Oral BID  ? dextromethorphan-guaiFENesin  2 tablet Oral BID  ? mometasone-formoterol  2 puff Inhalation BID  ? nicotine  21 mg Transdermal Daily  ? ?Continuous Infusions: ?PRN Meds:.acetaminophen **OR** acetaminophen, albuterol, cyclobenzaprine, ketorolac, ondansetron **OR** ondansetron (ZOFRAN) IV  ? ? ?Interim History / Subjective:  ?Feels better since admit / bp down nicely on clonidine  ? ?Objective   ?Blood pressure 120/72, pulse 79, temperature 98.5 ?F (36.9 ?C), temperature source Oral, resp. rate 19, height 5\' 11"  (1.803 m), weight 65 kg, SpO2 99 %. ?   ?   ? ?Intake/Output Summary (Last 24 hours) at 07/09/2021 0953 ?Last data filed at 07/09/2021 0945 ?Gross per 24 hour  ?Intake 116 ml  ?Output --  ?Net 116 ml  ? ?Filed Weights  ? 07/08/21 0754 07/08/21 1828  ?Weight: 65.8 kg 65 kg  ? ? ?Examination: ?Tmax  98.5  ?General appearance:    middle aged wm nad at 45 degrees hob  ?At Rest 02 sats  99% on 2lpm   ?No jvd ?Oropharynx clear,  mucosa nl ?Neck supple ?Lungs with a few scattered exp > insp rhonchi bilaterally and minimally decreased on R  ?RRR no s3 or or sign murmur ?Abd soft/ nl excursion  ?Extr warm with no edema or clubbing noted ?Neuro  Sensorium  intace,  no apparent motor deficits  ? ?  ?I personally reviewed images and agree with radiology impression as follows:  ?CXR:   pa and lateral 3/22 ?1. Moderate right hydropneumothorax, possibly minimally improved ?from 07/08/2021. ?2. Right basilar subsegmental atelectasis. ? ?Assessment & Plan:  ?1) Spontaneous R PTX no better p 24 h nasal 02 @ 2lpm  ?>>> increase to 6lpm so he doesn't have to wear a mask to get benefit on reabsorption ?>>> ct chest s contrast to see if there is any pleural adhesion that might interfere with chest tube placement if needed  ? ?2) HBP off acei since admit > ok on clonidine ? ?3) Likely underlying COPD/AB > rx dulera 100 / mucinex dm 1200 bid  ? ?Best Practice (right click and "Reselect all SmartList Selections" daily)  ? ?Per [facility]  ? ?Labs   ?CBC: ?Recent Labs  ?Lab 07/08/21 ?0757  ?WBC 6.3  ?HGB 15.0  ?HCT 45.1  ?MCV 95.8  ?PLT 271  ? ? ?Basic Metabolic Panel: ?Recent Labs  ?Lab 07/08/21 ?0757  ?NA 138  ?K 4.0  ?CL 101  ?CO2 28  ?GLUCOSE 99  ?BUN 12  ?CREATININE 0.80  ?CALCIUM 9.4  ? ?GFR: ?Estimated Creatinine Clearance: 103.8 mL/min (by C-G formula based on SCr of 0.8 mg/dL). ?Recent Labs  ?Lab 07/08/21 ?0757  ?WBC 6.3  ? ? ?Liver Function Tests: ?No results  for input(s): AST, ALT, ALKPHOS, BILITOT, PROT, ALBUMIN in the last 168 hours. ?No results for input(s): LIPASE, AMYLASE in the last 168 hours. ?No results for input(s): AMMONIA in the last 168 hours. ? ?ABG ?No results found for: PHART, PCO2ART, PO2ART, HCO3, TCO2, ACIDBASEDEF, O2SAT  ? ?Coagulation Profile: ?No results for input(s): INR, PROTIME in the last 168 hours. ? ?Cardiac Enzymes: ?No results for input(s): CKTOTAL, CKMB, CKMBINDEX, TROPONINI in the last 168 hours. ? ?HbA1C: ?No results found for: HGBA1C ? ?CBG: ?No results for input(s): GLUCAP in the last 168 hours. ? ?  ? ?Past Medical History:  ?He,  has a past medical history of Arthritis, Hypertension, and Shortness of breath dyspnea.  ? ?Surgical History:   ? ?Past Surgical History:  ?Procedure Laterality Date  ? ACETABULUM FRACTURE SURGERY Left   ? APPENDECTOMY    ? BACK SURGERY    ? Disectomy  ? FEMUR SURGERY Right   ? rod placed  ? ORIF CLAVICULAR FRACTURE Right 01/14/2016  ? Procedure: OPEN REDUCTION INTERNAL FIXATION (ORIF) CLAVICULAR FRACTURE;  Surgeon: Jones Broom, MD;  Location: [facility] OR;  Service: Orthopedics;  Laterality: Right;  ?  ? ?Social History:  ? reports that he has been smoking cigarettes. He has a 44.00 pack-year smoking history. He has never used smokeless tobacco. He reports current alcohol use. He reports that he does not currently use drugs after having used the following drugs: Marijuana. Frequency: 3.00 times per week.  ? ?Family History:  ?His family history is not on file.  ? ?Allergies ?No Known Allergies  ? ?Home Medications  ?Prior to Admission medications   ?Medication Sig Start Date End Date Taking? Authorizing Provider  ?cyclobenzaprine (FLEXERIL) 10 MG tablet Take 1 tablet (10 mg total) by mouth 2 (two) times daily as needed. ?Patient taking differently: Take 10 mg by mouth 2 (two) times daily as needed for muscle spasms. 06/22/19  Yes Donnetta Hutching, MD  ?lisinopril (ZESTRIL) 10 MG tablet Take 1 tablet by mouth daily. 05/23/20  Yes [provider]  ?HYDROcodone-acetaminophen (NORCO/VICODIN) 5-325 MG tablet Take 1 tablet by mouth every 6 (six) hours as needed for moderate pain. ?Patient not taking: Reported on 09/02/2020 06/27/19   Eldred Manges, MD  ?naproxen (NAPROSYN) 500 MG tablet Take 1 tablet (500 mg total) by mouth 2 (two) times daily. ?Patient not taking: Reported on 09/02/2020 06/22/19   Donnetta Hutching, MD  ?oxyCODONE-acetaminophen (PERCOCET) 5-325 MG tablet Take 1 tablet by mouth every 4 (four) hours as needed for moderate pain. ?Patient not taking: Reported on 06/27/2019 01/11/16   Dione Booze, MD  ?oxyCODONE-acetaminophen (PERCOCET) 5-325 MG tablet Take 1-2 tablets by mouth every 4 (four) hours as needed for moderate  pain. ?Patient not taking: Reported on 06/27/2019 01/14/16   Jiles Harold, PA-C  ?predniSONE (DELTASONE) 10 MG tablet Take 2 tablets (20 mg total) by mouth daily. ?Patient not taking: Reported on 09/02/2020 06/22/19   Donnetta Hutching, MD  ?trimethoprim-polymyxin b [facility]) ophthalmic solution Place 1 drop into the left eye every 4 (four) hours. ?Patient not taking: Reported on 07/08/2021 09/02/20   Cheryll Cockayne, MD  ?  ?  ?Sandrea Hughs, MD ?Pulmonary and Critical Care Medicine ?[facility] ?Cell [phone]  ? ?After 7:00 pm call Elink  [phone]  ? ?  ?

## 2021-07-09 NOTE — Hospital Course (Addendum)
Per HPI: ?Joel Salazar is a 51 y.o. male with medical history significant of hypertension, arthritis, chronic tobacco use, reports that he began noticing dyspnea on exertion and central chest pressure for the past 2 days.  Symptoms may have started after a prolonged episode of coughing, although he is not completely sure.  He has not had any fever.  He has a chronic productive cough which he attributes to his smoking.  Sputum is clear and has not changed.  He has not had any vomiting, diarrhea.  He has noticed his dyspnea is worse on exertion.  He is unable to do his usual activities.  When his symptoms persisted, he contacted his primary care physician's office today was directed to come to the ER for evaluation ? ?07/09/21: Patient was admitted with moderate sized right-sided pneumothorax in the setting of COPD with ongoing tobacco abuse.  He continues to have significant findings on CT scan.  Pulmonology recommending initiation of 6 L nasal cannula oxygen. ? ?07/10/21: Patient is was originally planned to go to [facility] with IR to place pigtail catheter, however chest x-ray showing signs of improvement.  He will have ongoing monitoring at [facility]. ? ?07/11/2021: Patient is in stable condition for discharge today per pulmonology.  He does not require any oxygen at discharge and will be given antitussives to continue taking and has been counseled on smoking cessation.  He will avoid lisinopril as well.  Close follow-up with Dr. Sherene Sires as noted on 3/27. ?

## 2021-07-09 NOTE — TOC Progression Note (Signed)
Transition of Care (TOC) - Progression Note  ? ? ?Patient Details  ?Name: Joel Salazar ?MRN: [id number] ?Date of Birth: [birth year] ? ?Transition of Care (TOC) CM/SW Contact  ?Karn Cassis, LCSW ?Phone Number: ?07/09/2021, 10:38 AM ? ?Clinical Narrative:   ?Transition of Care (TOC) Screening Note ? ? ?Patient Details  ?Name: Joel Salazar ?Date of Birth: 1971-04-13 ? ? ?Transition of Care (TOC) CM/SW Contact:    ?Karn Cassis, LCSW ?Phone Number: ?07/09/2021, 10:38 AM ? ? ? ?Transition of Care Department [facility]) has reviewed patient and no TOC needs have been identified at this time. We will continue to monitor patient advancement through interdisciplinary progression rounds. If new patient transition needs arise, please place a TOC consult. ?   ? ? ? ?  ?Barriers to Discharge: Barriers Resolved ? ?Expected Discharge Plan and Services ?  ?  ?  ?  ?  ?                ?  ?  ?  ?  ?  ?  ?  ?  ?  ?  ? ? ?Social Determinants of Health (SDOH) Interventions ?  ? ?Readmission Risk Interventions ?   ? View : No data to display.  ?  ?  ?  ? ? ?

## 2021-07-09 NOTE — Progress Notes (Signed)
? ? ?  Request made for Rt chest tube placement for PTX ?Images reviewed and approved. ? ?Pt is to be at [facility] by 830 am via ambulance ?APP RN to arrange  ? ?Pt will return to [facility] ? ?IR PA will see pt in am for consent ? ?RN aware ?

## 2021-07-10 ENCOUNTER — Encounter (HOSPITAL_COMMUNITY): Payer: Self-pay | Admitting: Internal Medicine

## 2021-07-10 ENCOUNTER — Inpatient Hospital Stay (HOSPITAL_COMMUNITY): Payer: Medicare HMO

## 2021-07-10 DIAGNOSIS — J9311 Primary spontaneous pneumothorax: Secondary | ICD-10-CM | POA: Diagnosis not present

## 2021-07-10 LAB — HIV ANTIBODY (ROUTINE TESTING W REFLEX): HIV Screen 4th Generation wRfx: NONREACTIVE

## 2021-07-10 LAB — PROTIME-INR
INR: 1 (ref 0.8–1.2)
Prothrombin Time: 12.6 seconds (ref 11.4–15.2)

## 2021-07-10 MED ORDER — ALBUTEROL SULFATE (2.5 MG/3ML) 0.083% IN NEBU: 2.5000 mg | INHALATION_SOLUTION | RESPIRATORY_TRACT | Status: DC | PRN

## 2021-07-10 NOTE — Progress Notes (Signed)
This patient will not require transfer any longer, nor does he require a pigtail catheter as his pneumothorax is improving on chest x-ray will continue to monitor  ?

## 2021-07-10 NOTE — Consult Note (Signed)
? ?Chief Complaint: ?Patient was seen in consultation today for right chest tube placement ?Chief Complaint  ?Patient presents with  ? Chest Pain  ? at the request of Dr Sherene SiresWert ? ?Supervising Physician: Irish LackYamagata, Glenn ? ?Patient Status: [facility] IP ? ?History of Present Illness: ?Joel Salazar is a 51 y.o. male  ? ?HTN; Smoker ?Dyspnea on exertion and chest pain and cough last few days ?Contacted PMD - instructed to go to ED ? ?Presented ED 3/21 ?CXR: IMPRESSION: ?Moderate-sized right hydropneumothorax, suspect related to right ?apex bullous disease ?CT yesterday: 1. Moderate right pneumothorax, likely secondary to ruptured apical ?bleb. ?2. Linear opacity and consolidation of the right lower lobe with ?associated bronchial wall thickening, likely due to infection or ?aspiration. ?3. Emphysema (ICD10-J43.9). ? ?Seen by Dr Sherene SiresWert ?Request made for R Chest tube placement ? ?Dr Fredia SorrowYamagata has reviewed imaging ?Approves procedure ?Scheduled today in [facility] ? ?[facility] per MD ?This is being arranged now ? ? ? ? ?Past Medical History:  ?Diagnosis Date  ? Arthritis   ? Hypertension   ? Shortness of breath dyspnea   ? due to rib fractures  ? ? ?Past Surgical History:  ?Procedure Laterality Date  ? ACETABULUM FRACTURE SURGERY Left   ? APPENDECTOMY    ? BACK SURGERY    ? Disectomy  ? FEMUR SURGERY Right   ? rod placed  ? ORIF CLAVICULAR FRACTURE Right 01/14/2016  ? Procedure: OPEN REDUCTION INTERNAL FIXATION (ORIF) CLAVICULAR FRACTURE;  Surgeon: Jones BroomJustin Chandler, MD;  Location: [facility] OR;  Service: Orthopedics;  Laterality: Right;  ? ? ?Allergies: ?Patient has no known allergies. ? ?Medications: ?Prior to Admission medications   ?Medication Sig Start Date End Date Taking? Authorizing Provider  ?cyclobenzaprine (FLEXERIL) 10 MG tablet Take 1 tablet (10 mg total) by mouth 2 (two) times daily as needed. ?Patient taking differently: Take 10 mg by mouth 2 (two) times daily as needed for  muscle spasms. 06/22/19  Yes Donnetta Hutchingook, Brian, MD  ?lisinopril (ZESTRIL) 10 MG tablet Take 1 tablet by mouth daily. 05/23/20  Yes [provider]  ?HYDROcodone-acetaminophen (NORCO/VICODIN) 5-325 MG tablet Take 1 tablet by mouth every 6 (six) hours as needed for moderate pain. ?Patient not taking: Reported on 09/02/2020 06/27/19   Eldred MangesYates, Mark C, MD  ?naproxen (NAPROSYN) 500 MG tablet Take 1 tablet (500 mg total) by mouth 2 (two) times daily. ?Patient not taking: Reported on 09/02/2020 06/22/19   Donnetta Hutchingook, Brian, MD  ?oxyCODONE-acetaminophen (PERCOCET) 5-325 MG tablet Take 1 tablet by mouth every 4 (four) hours as needed for moderate pain. ?Patient not taking: Reported on 06/27/2019 01/11/16   Dione BoozeGlick, David, MD  ?oxyCODONE-acetaminophen (PERCOCET) 5-325 MG tablet Take 1-2 tablets by mouth every 4 (four) hours as needed for moderate pain. ?Patient not taking: Reported on 06/27/2019 01/14/16   Jiles HaroldLaliberte, Danielle, PA-C  ?predniSONE (DELTASONE) 10 MG tablet Take 2 tablets (20 mg total) by mouth daily. ?Patient not taking: Reported on 09/02/2020 06/22/19   Donnetta Hutchingook, Brian, MD  ?trimethoprim-polymyxin b [facility](POLYTRIM) ophthalmic solution Place 1 drop into the left eye every 4 (four) hours. ?Patient not taking: Reported on 07/08/2021 09/02/20   Cheryll CockayneHong, Joshua S, MD  ?  ? ?History reviewed. No pertinent family history. ? ?Social History  ? ?Socioeconomic History  ? Marital status: Single  ?  Spouse name: Not on file  ? Number of children: Not on file  ? Years of education: Not on file  ? Highest education  level: Not on file  ?Occupational History  ? Not on file  ?Tobacco Use  ? Smoking status: Every Day  ?  Packs/day: 2.00  ?  Years: 22.00  ?  Pack years: 44.00  ?  Types: Cigarettes  ? Smokeless tobacco: Never  ?Vaping Use  ? Vaping Use: Never used  ?Substance and Sexual Activity  ? Alcohol use: Yes  ?  Comment: 4 or 5 beers/night  ? Drug use: Not Currently  ?  Frequency: 3.0 times per week  ?  Types: Marijuana  ? Sexual activity: Not on file  ?Other  Topics Concern  ? Not on file  ?Social History Narrative  ? Not on file  ? ?Social Determinants of Health  ? ?Financial Resource Strain: Not on file  ?Food Insecurity: Not on file  ?Transportation Needs: Not on file  ?Physical Activity: Not on file  ?Stress: Not on file  ?Social Connections: Not on file  ? ? ?Review of Systems: A 12 point ROS discussed and pertinent positives are indicated in the HPI above.  All other systems are negative. ? ?Review of Systems  ?Constitutional:  Positive for activity change. Negative for fatigue and fever.  ?Respiratory:  Positive for cough and shortness of breath.   ?Cardiovascular:  Positive for chest pain.  ?Gastrointestinal:  Negative for abdominal pain.  ?Neurological:  Negative for weakness.  ?Psychiatric/Behavioral:  Negative for behavioral problems and confusion.   ? ?Vital Signs: ?BP 119/79 (BP Location: Right Arm)   Pulse 69   Temp 97.9 ?F (36.6 ?C)   Resp 16   Ht 5\' 11"  (1.803 m)   Wt 143 lb 4.8 oz (65 kg)   SpO2 98%   BMI 19.99 kg/m?  ? ?Physical Exam ?Vitals reviewed.  ?Cardiovascular:  ?   Rate and Rhythm: Normal rate and regular rhythm.  ?Pulmonary:  ?   Effort: Pulmonary effort is normal.  ?   Breath sounds: Examination of the right-upper field reveals decreased breath sounds. Examination of the right-middle field reveals decreased breath sounds. Examination of the right-lower field reveals decreased breath sounds. Decreased breath sounds present.  ?Abdominal:  ?   Palpations: Abdomen is soft.  ?   Tenderness: There is no abdominal tenderness.  ?Musculoskeletal:     ?   General: Normal range of motion.  ?Skin: ?   General: Skin is warm.  ?Neurological:  ?   Mental Status: He is alert and oriented to person, place, and time.  ?Psychiatric:     ?   Behavior: Behavior normal.  ? ? ?Imaging: ?DG Chest 2 View ? ?Result Date: 07/09/2021 ?CLINICAL DATA:  Right pneumothorax. EXAM: CHEST - 2 VIEW COMPARISON:  07/08/2021 and CT chest 01/13/2009. FINDINGS: Trachea is  midline. Heart size normal. Lungs are hyperinflated with biapical bullous emphysema. Moderate right pneumothorax and small right pleural effusion appear similar to minimally improved. Right basilar subsegmental atelectasis. Left lung is clear. No left pleural fluid. Plate and screw fixation of the right clavicle. IMPRESSION: 1. Moderate right hydropneumothorax, possibly minimally improved from 07/08/2021. 2. Right basilar subsegmental atelectasis. Electronically Signed   By: 07/10/2021 M.D.   On: 07/09/2021 09:45  ? ?DG Chest 2 View ? ?Result Date: 07/08/2021 ?CLINICAL DATA:  Chest pain with shortness of breath and cough EXAM: CHEST - 2 VIEW COMPARISON:  01/11/2016 FINDINGS: Moderate sized right pneumothorax with a right base pleural fluid level compatible with a hydropneumothorax. Right apical bullous disease noted. Mild hyperinflation suspect background COPD/emphysema. Normal heart size  and vascularity. Trachea midline. Previous right clavicle ORIF. No acute osseous finding. IMPRESSION: Moderate-sized right hydropneumothorax, suspect related to right apex bullous disease. These results were called by telephone at the time of interpretation on 07/08/2021 at 8:27 am to provider Pricilla Loveless , who verbally acknowledged these results. Electronically Signed   By: Judie Petit.  Shick M.D.   On: 07/08/2021 08:28  ? ?CT CHEST WO CONTRAST ? ?Result Date: 07/09/2021 ?CLINICAL DATA:  Pneumothorax EXAM: CT CHEST WITHOUT CONTRAST TECHNIQUE: Multidetector CT imaging of the chest was performed following the standard protocol without IV contrast. RADIATION DOSE REDUCTION: This exam was performed according to the departmental dose-optimization program which includes automated exposure control, adjustment of the mA and/or kV according to patient size and/or use of iterative reconstruction technique. COMPARISON:  Same day chest radiograph FINDINGS: Cardiovascular: Normal heart size. No pericardial effusion. No significant coronary artery  calcifications or atherosclerotic disease of the thoracic aorta. Mediastinum/Nodes: Esophagus and thyroid are unremarkable. No pathologically enlarged lymph nodes seen in the chest. Lungs/Pleura: Central airways are

## 2021-07-10 NOTE — Progress Notes (Addendum)
?PROGRESS NOTE ? ? ? ?Joel Salazar  [id number] DOB: 04/24/1970 DOA: 07/08/2021 ?PCP: [facility] ? ? ?Brief Narrative:  ?Per HPI: ?Joel Salazar is a 51 y.o. male with medical history significant of hypertension, arthritis, chronic tobacco use, reports that he began noticing dyspnea on exertion and central chest pressure for the past 2 days.  Symptoms may have started after a prolonged episode of coughing, although he is not completely sure.  He has not had any fever.  He has a chronic productive cough which he attributes to his smoking.  Sputum is clear and has not changed.  He has not had any vomiting, diarrhea.  He has noticed his dyspnea is worse on exertion.  He is unable to do his usual activities.  When his symptoms persisted, he contacted his primary care physician's office today was directed to come to the ER for evaluation ? ?07/09/21: Patient was admitted with moderate sized right-sided pneumothorax in the setting of COPD with ongoing tobacco abuse.  He continues to have significant findings on CT scan.  Pulmonology recommending initiation of 6 L nasal cannula oxygen. ? ?07/10/21: Patient is was originally planned to go to [facility] with IR to place pigtail catheter, however chest x-ray showing signs of improvement.  He will have ongoing monitoring at [facility].  ? ? ?Assessment & Plan: ?  ?Principal Problem: ?  Pneumothorax ?Active Problems: ?  Tobacco abuse ?  COPD (chronic obstructive pulmonary disease) ([place]) ?  Essential hypertension ? ?Assessment and Plan: ?* Pneumothorax ?Initial chest x-ray indicates moderate pneumothorax; plan to recheck portable chest x-ray today ?Patient reports that this may have began after an episode of coughing ?-He has been placed on 6 L nasal cannula oxygen ?He says that the majority of his dyspnea is on exertion ?-Appreciate ongoing pulmonology evaluation ?-Portable chest x-ray with improvement noted on 3/23 ? ?Essential  hypertension ?Lisinopril held for now ?Patient states that he does not use clonidine at home ?As needed IV medications as needed ?Blood pressure is currently controlled ? ?COPD (chronic obstructive pulmonary disease) ([place]) ?He does not have a formal diagnosis of COPD, but has a long history of tobacco use ?He has smoked 1-1/2 packs/day for many years ?Noted to have hyperinflation on chest x-ray ?Will use albuterol as needed for wheezing ? ?Tobacco abuse ?Counseled on the importance of tobacco cessation ?We will provide nicotine patch ? ? ?DVT prophylaxis:Lovenox ?Code Status: Full ?Family Communication: None at bedside ?Disposition Plan:  ?Status is: Inpatient ?Remains inpatient appropriate because: Need for inpatient procedures and close monitoring. ?  ?Consultants:  ?PCCM ?  ?Procedures:  ?See below ?  ?Antimicrobials:  ?None ?  ? ?Subjective: ?Patient seen and evaluated today with no new acute complaints or concerns. No acute concerns or events noted overnight he has slept well overnight and remains on 6 L nasal cannula.  He denies any significant dyspnea or chest pain. ? ?Objective: ?Vitals:  ? 07/09/21 2012 07/09/21 2031 07/10/21 0509 07/10/21 0826  ?BP:  137/79 119/79   ?Pulse:  90 69   ?Resp:  16 16   ?Temp:  97.6 ?F (36.4 ?C) 97.9 ?F (36.6 ?C)   ?TempSrc:      ?SpO2: 98% 100% 98% 98%  ?Weight:      ?Height:      ? ? ?Intake/Output Summary (Last 24 hours) at 07/10/2021 1112 ?Last data filed at 07/09/2021 2100 ?Gross per 24 hour  ?Intake 1675 ml  ?Output --  ?Net 1675  ml  ? ?Filed Weights  ? 07/08/21 0754 07/08/21 1828  ?Weight: 65.8 kg 65 kg  ? ? ?Examination: ? ?General exam: Appears calm and comfortable  ?Respiratory system: Diminished right greater than left. Respiratory effort normal.  Currently on 6 L nasal cannula. ?Cardiovascular system: S1 & S2 heard, RRR.  ?Gastrointestinal system: Abdomen is soft ?Central nervous system: Alert and awake ?Extremities: No edema ?Skin: No significant lesions  noted ?Psychiatry: Flat affect. ? ? ? ?Data Reviewed: I have personally reviewed following labs and imaging studies ? ?CBC: ?Recent Labs  ?Lab 07/08/21 ?[id number]  ?WBC 6.3  ?HGB 15.0  ?HCT 45.1  ?MCV 95.8  ?PLT 271  ? ?Basic Metabolic Panel: ?Recent Labs  ?Lab 07/08/21 ?[id number]  ?NA 138  ?K 4.0  ?CL 101  ?CO2 28  ?GLUCOSE 99  ?BUN 12  ?CREATININE 0.80  ?CALCIUM 9.4  ? ?GFR: ?Estimated Creatinine Clearance: 103.8 mL/min (by C-G formula based on SCr of 0.8 mg/dL). ?Liver Function Tests: ?No results for input(s): AST, ALT, ALKPHOS, BILITOT, PROT, ALBUMIN in the last 168 hours. ?No results for input(s): LIPASE, AMYLASE in the last 168 hours. ?No results for input(s): AMMONIA in the last 168 hours. ?Coagulation Profile: ?Recent Labs  ?Lab 07/09/21 ?1414 07/10/21 ?0501  ?INR 1.0 1.0  ? ?Cardiac Enzymes: ?No results for input(s): CKTOTAL, CKMB, CKMBINDEX, TROPONINI in the last 168 hours. ?BNP (last 3 results) ?No results for input(s): PROBNP in the last 8760 hours. ?HbA1C: ?No results for input(s): HGBA1C in the last 72 hours. ?CBG: ?No results for input(s): GLUCAP in the last 168 hours. ?Lipid Profile: ?No results for input(s): CHOL, HDL, LDLCALC, TRIG, CHOLHDL, LDLDIRECT in the last 72 hours. ?Thyroid Function Tests: ?No results for input(s): TSH, T4TOTAL, FREET4, T3FREE, THYROIDAB in the last 72 hours. ?Anemia Panel: ?No results for input(s): VITAMINB12, FOLATE, FERRITIN, TIBC, IRON, RETICCTPCT in the last 72 hours. ?Sepsis Labs: ?No results for input(s): PROCALCITON, LATICACIDVEN in the last 168 hours. ? ?No results found for this or any previous visit (from the past 240 hour(s)).  ? ? ? ? ? ?Radiology Studies: ?DG Chest 2 View ? ?Result Date: 07/09/2021 ?CLINICAL DATA:  Right pneumothorax. EXAM: CHEST - 2 VIEW COMPARISON:  07/08/2021 and CT chest 01/13/2009. FINDINGS: Trachea is midline. Heart size normal. Lungs are hyperinflated with biapical bullous emphysema. Moderate right pneumothorax and small right pleural effusion  appear similar to minimally improved. Right basilar subsegmental atelectasis. Left lung is clear. No left pleural fluid. Plate and screw fixation of the right clavicle. IMPRESSION: 1. Moderate right hydropneumothorax, possibly minimally improved from 07/08/2021. 2. Right basilar subsegmental atelectasis. Electronically Signed   By: Lorin Picket M.D.   On: 07/09/2021 09:45  ? ?CT CHEST WO CONTRAST ? ?Result Date: 07/09/2021 ?CLINICAL DATA:  Pneumothorax EXAM: CT CHEST WITHOUT CONTRAST TECHNIQUE: Multidetector CT imaging of the chest was performed following the standard protocol without IV contrast. RADIATION DOSE REDUCTION: This exam was performed according to the departmental dose-optimization program which includes automated exposure control, adjustment of the mA and/or kV according to patient size and/or use of iterative reconstruction technique. COMPARISON:  Same day chest radiograph FINDINGS: Cardiovascular: Normal heart size. No pericardial effusion. No significant coronary artery calcifications or atherosclerotic disease of the thoracic aorta. Mediastinum/Nodes: Esophagus and thyroid are unremarkable. No pathologically enlarged lymph nodes seen in the chest. Lungs/Pleura: Central airways are patent. Centrilobular emphysema with apical blebs. Moderate right pneumothorax. Linear opacity and consolidation of the right lower lobe with associated bronchial wall thickening. Upper Abdomen:  No acute abnormality. Musculoskeletal: Chronic osseous deformity of the lateral right ribs. No aggressive osseous lesions. IMPRESSION: 1. Moderate right pneumothorax, likely secondary to ruptured apical bleb. 2. Linear opacity and consolidation of the right lower lobe with associated bronchial wall thickening, likely due to infection or aspiration. 3. Emphysema (ICD10-J43.9). Electronically Signed   By: Yetta Glassman M.D.   On: 07/09/2021 10:53  ? ?DG Chest Port 1 View ? ?Result Date: 07/10/2021 ?CLINICAL DATA:  Pneumothorax  EXAM: PORTABLE CHEST 1 VIEW COMPARISON:  Previous studies including the examination done on 07/09/2021 FINDINGS: There is interval decrease in the loculated pneumothorax in the periphery of right lower lung fields and right apical pneumot

## 2021-07-10 NOTE — Progress Notes (Signed)
? ?NAME:  Keylon Wharff, MRN:  [id number], DOB:  27-Jul-1970, LOS: 1 ?ADMISSION DATE:  07/08/2021, CONSULTATION DATE:  07/09/21 ?REFERRING MD:  [facility], CHIEF COMPLAINT:  R cp  ? ?History of Present Illness:  ?72 yowm smoker on ACEi with cough x 1 week PTA mucoid then abrupt R CP 3/19 and in ER 3/21 with Small R PTX and PCCM service asked to evaluate.  Pt denies sob but chronically limited by multiple orthopedic issues previously related to separate traumatic events  ? ?Pertinent  Medical History  ?HBP on ACEi ?Arthritis/ chronic pain s/p multiple orthopedic surgeries  ? ?Significant Hospital Events: ?Including procedures, antibiotic start and stop dates in addition to other pertinent events   ?CT cehst 3/22 >>> ? ?Scheduled Meds: ? cloNIDine  0.1 mg Oral BID  ? dextromethorphan-guaiFENesin  2 tablet Oral BID  ? mometasone-formoterol  2 puff Inhalation BID  ? nicotine  21 mg Transdermal Daily  ? ?Continuous Infusions: ?PRN Meds:.acetaminophen **OR** acetaminophen, albuterol, cyclobenzaprine, hydrALAZINE, ketorolac, ondansetron **OR** ondansetron (ZOFRAN) IV  ? ? ?Interim History / Subjective:  ?No cp or sob/ sitting up in chair eating lunch  ? ?Objective   ?Blood pressure 129/79, pulse 79, temperature 98.2 ?F (36.8 ?C), resp. rate 20, height 5\' 11"  (1.803 m), weight 65 kg, SpO2 99 %. ?   ?   ? ?Intake/Output Summary (Last 24 hours) at 07/10/2021 1323 ?Last data filed at 07/10/2021 1300 ?Gross per 24 hour  ?Intake 1320 ml  ?Output --  ?Net 1320 ml  ? ?Filed Weights  ? 07/08/21 0754 07/08/21 1828  ?Weight: 65.8 kg 65 kg  ? ? ?Examination: ?Tmax   98.2  ?General appearance:   sitting in chair, still smoker's rattle  ? No jvd ?Oropharynx clear,  mucosa nl ?Neck supple ?Lungs with a few scattered exp > insp rhonchi bilaterally ?RRR no s3 or or sign murmur ?Abd soft wiith  excursion  ?Extr warm with no edema - Moderate  clubbing noted ?Neuro  Sensorium alert/ approp ,  no apparent motor deficits  ? ?  ? ?I personally  reviewed images and agree with radiology impression as follows:  ?CXR:   portable 3/23 ?There is interval decrease in right pneumothorax. ? ?Assessment & Plan:  ?1) Spontaneous R PTX improved last 2 h on 6lpm np and don't think chest tube is needed at this point  ?>>> keep on 6lpm overnight and recheck cxr in am then ? D/c ? ?2) HBP ok off acei since admit > resume clonidine which will help with anxiety and smoking urge related to nicotine w/d ? ?3) Likely underlying COPD/AB >  continue dulera 100 / mucinex dm 1200 bid  ? ?F/u with me in 2 weeks and if recurrent cp like he had when came in should to [facility] directly, advised.  ? ?Best Practice (right click and "Reselect all SmartList Selections" daily)  ? ?Per [facility]  ? ?Labs   ?CBC: ?Recent Labs  ?Lab 07/08/21 ?[id number]  ?WBC 6.3  ?HGB 15.0  ?HCT 45.1  ?MCV 95.8  ?PLT 271  ? ? ?Basic Metabolic Panel: ?Recent Labs  ?Lab 07/08/21 ?[id number]  ?NA 138  ?K 4.0  ?CL 101  ?CO2 28  ?GLUCOSE 99  ?BUN 12  ?CREATININE 0.80  ?CALCIUM 9.4  ? ?GFR: ?Estimated Creatinine Clearance: 103.8 mL/min (by C-G formula based on SCr of 0.8 mg/dL). ?Recent Labs  ?Lab 07/08/21 ?[id number]  ?WBC 6.3  ? ? ?Liver Function Tests: ?No results for input(s): AST, ALT, ALKPHOS,  BILITOT, PROT, ALBUMIN in the last 168 hours. ?No results for input(s): LIPASE, AMYLASE in the last 168 hours. ?No results for input(s): AMMONIA in the last 168 hours. ? ?ABG ?No results found for: PHART, PCO2ART, PO2ART, HCO3, TCO2, ACIDBASEDEF, O2SAT  ? ?Coagulation Profile: ?Recent Labs  ?Lab 07/09/21 ?1414 07/10/21 ?0501  ?INR 1.0 1.0  ? ? ?Cardiac Enzymes: ?No results for input(s): CKTOTAL, CKMB, CKMBINDEX, TROPONINI in the last 168 hours. ? ?HbA1C: ?No results found for: HGBA1C ? ?CBG: ?No results for input(s): GLUCAP in the last 168 hours. ? ?   ?Christinia Gully, MD ?Pulmonary and Critical Care Medicine ?[facility] ?Cell [phone]  ? ?After 7:00 pm call Elink  [phone]  ?  ?

## 2021-07-10 NOTE — Progress Notes (Signed)
Pt stated that he has lisinopril 10 mg that he takes for bp at home and upon am med pass refused cloNIDine (CATAPRES) tablet 0.1 mg    ?And stated that he had taken home med that was provided by his girl friend that is at bedside,This LPN educated both on te importance of not taking home meds while in patient in the hospital, and that moving forward those will need to ne sent to the pharmacy ?

## 2021-07-11 ENCOUNTER — Telehealth: Payer: Self-pay | Admitting: Internal Medicine

## 2021-07-11 ENCOUNTER — Inpatient Hospital Stay (HOSPITAL_COMMUNITY): Payer: Medicare HMO

## 2021-07-11 DIAGNOSIS — J939 Pneumothorax, unspecified: Secondary | ICD-10-CM | POA: Diagnosis not present

## 2021-07-11 DIAGNOSIS — J9311 Primary spontaneous pneumothorax: Secondary | ICD-10-CM | POA: Diagnosis not present

## 2021-07-11 DIAGNOSIS — J9811 Atelectasis: Secondary | ICD-10-CM | POA: Diagnosis not present

## 2021-07-11 LAB — CBC
HCT: 39.4 % (ref 39.0–52.0)
Hemoglobin: 13.3 g/dL (ref 13.0–17.0)
MCH: 33.3 pg (ref 26.0–34.0)
MCHC: 33.8 g/dL (ref 30.0–36.0)
MCV: 98.5 fL (ref 80.0–100.0)
Platelets: 253 10*3/uL (ref 150–400)
RBC: 4 MIL/uL — ABNORMAL LOW (ref 4.22–5.81)
RDW: 12.3 % (ref 11.5–15.5)
WBC: 7.4 10*3/uL (ref 4.0–10.5)
nRBC: 0 % (ref 0.0–0.2)

## 2021-07-11 LAB — BASIC METABOLIC PANEL
Anion gap: 7 (ref 5–15)
BUN: 11 mg/dL (ref 6–20)
CO2: 26 mmol/L (ref 22–32)
Calcium: 8.9 mg/dL (ref 8.9–10.3)
Chloride: 104 mmol/L (ref 98–111)
Creatinine, Ser: 0.65 mg/dL (ref 0.61–1.24)
GFR, Estimated: >60 mL/min (ref >60)
Glucose, Bld: 93 mg/dL (ref 70–99)
Potassium: 4.4 mmol/L (ref 3.5–5.1)
Sodium: 137 mmol/L (ref 135–145)

## 2021-07-11 LAB — MAGNESIUM: Magnesium: 2 mg/dL (ref 1.7–2.4)

## 2021-07-11 MED ORDER — MOMETASONE FURO-FORMOTEROL FUM 100-5 MCG/ACT IN AERO: 2.0000 | INHALATION_SPRAY | Freq: Two times a day (BID) | RESPIRATORY_TRACT | 1 refills | Status: DC

## 2021-07-11 MED ORDER — DM-GUAIFENESIN ER 30-600 MG PO TB12: 2.0000 | ORAL_TABLET | Freq: Two times a day (BID) | ORAL | 0 refills | Status: AC

## 2021-07-11 MED ORDER — NICOTINE 21 MG/24HR TD PT24: 21.0000 mg | MEDICATED_PATCH | Freq: Every day | TRANSDERMAL | 0 refills | Status: DC

## 2021-07-11 MED ORDER — CLONIDINE HCL 0.1 MG PO TABS: 0.1000 mg | ORAL_TABLET | Freq: Two times a day (BID) | ORAL | 11 refills | Status: AC

## 2021-07-11 NOTE — Telephone Encounter (Signed)
Pt called to schedule his urgent referral. MW is booked until May in [place] and until 2/24 is [place]. I went ahead and scheduled on 4/24 but the comments in the referral indicate that he needs to be seen ASAP...hopefully on Monday, 3/27. Can MW double book himself to see this pt? Pt does not want to wait a month nor does it seem he should. All providers are booked out until 4/18. Please advise, and if able to see sooner, call pt and let him know.  ?

## 2021-07-11 NOTE — Discharge Summary (Signed)
?Physician Discharge Summary ?  ?Patient: Joel Salazar MRN: [id number] DOB: 1971-04-20  ?Admit date:     07/08/2021  ?Discharge date: 07/11/21  ?Discharge Physician: Rodena Goldmann  ? ?PCP: [place]  ? ?Recommendations at discharge:  ? ?Follow-up with Dr. Melvyn Novas as scheduled on 3/27 for follow-up regarding pneumothorax ?Continue Mucinex twice daily and Dulera as prescribed ?Avoid use of lisinopril and continue clonidine ?Patient states that he will stop smoking and nicotine patches have been provided ? ?Discharge Diagnoses: ?Principal Problem: ?  Pneumothorax ?Active Problems: ?  Tobacco abuse ?  COPD (chronic obstructive pulmonary disease) ([place]) ?  Essential hypertension ? ?Resolved Problems: ?  * No resolved hospital problems. * ? ?Hospital Course: ?Per HPI: ?Joel Salazar is a 51 y.o. male with medical history significant of hypertension, arthritis, chronic tobacco use, reports that he began noticing dyspnea on exertion and central chest pressure for the past 2 days.  Symptoms may have started after a prolonged episode of coughing, although he is not completely sure.  He has not had any fever.  He has a chronic productive cough which he attributes to his smoking.  Sputum is clear and has not changed.  He has not had any vomiting, diarrhea.  He has noticed his dyspnea is worse on exertion.  He is unable to do his usual activities.  When his symptoms persisted, he contacted his primary care physician's office today was directed to come to the ER for evaluation ? ?07/09/21: Patient was admitted with moderate sized right-sided pneumothorax in the setting of COPD with ongoing tobacco abuse.  He continues to have significant findings on CT scan.  Pulmonology recommending initiation of 6 L nasal cannula oxygen. ? ?07/10/21: Patient is was originally planned to go to [facility] with IR to place pigtail catheter, however chest x-ray showing signs of improvement.  He will have ongoing monitoring at  [facility] ? ?07/11/2021: Patient is in stable condition for discharge today per pulmonology.  He does not require any oxygen at discharge and will be given antitussives to continue taking and has been counseled on smoking cessation.  He will avoid lisinopril as well.  Close follow-up with Dr. Melvyn Novas as noted on 3/27. ? ?Assessment and Plan: ?* Pneumothorax ?Follow-up with Dr. Melvyn Novas as noted 3/27 ?No need for oxygen on discharge ?Prescribed antitussives to continue ? ?Essential hypertension ?Avoid use of lisinopril and remain on clonidine as prescribed ? ?COPD (chronic obstructive pulmonary disease) ([place]) ?He does not have a formal diagnosis of COPD, but has a long history of tobacco use ?He has smoked 1-1/2 packs/day for many years ?Noted to have hyperinflation on chest x-ray ?Will use albuterol as needed for wheezing ?Advised on smoking cessation and will continue on Dulera and follow-up with pulmonary ? ?Tobacco abuse ?Counseled on the importance of tobacco cessation ?We will provide nicotine patch ? ? ? ? ?  ? ? ?Consultants: PCCM ?Procedures performed: None ?Disposition: Home ?Diet recommendation:  ?Discharge Diet Orders (From admission, onward)  ? ?  Start     Ordered  ? 07/11/21 0000  Diet - low sodium heart healthy       ? 07/11/21 0929  ? ?  ?  ? ?  ? ?Cardiac diet ?DISCHARGE MEDICATION: ?Allergies as of 07/11/2021   ?No Known Allergies ?  ? ?  ?Medication List  ?  ? ?STOP taking these medications   ? ?HYDROcodone-acetaminophen 5-325 MG tablet ?Commonly known as: NORCO/VICODIN ?  ?lisinopril 10  MG tablet ?Commonly known as: ZESTRIL ?  ?naproxen 500 MG tablet ?Commonly known as: NAPROSYN ?  ?oxyCODONE-acetaminophen 5-325 MG tablet ?Commonly known as: Percocet ?  ?predniSONE 10 MG tablet ?Commonly known as: DELTASONE ?  ?trimethoprim-polymyxin b ophthalmic solution ?Commonly known as: Polytrim ?  ? ?  ? ?TAKE these medications   ? ?cloNIDine 0.1 MG tablet ?Commonly known as: CATAPRES ?Take 1 tablet (0.1 mg total)  by mouth 2 (two) times daily. ?  ?cyclobenzaprine 10 MG tablet ?Commonly known as: FLEXERIL ?Take 1 tablet (10 mg total) by mouth 2 (two) times daily as needed. ?What changed: reasons to take this ?  ?dextromethorphan-guaiFENesin 30-600 MG 12hr tablet ?Commonly known as: Icard DM ?Take 2 tablets by mouth 2 (two) times daily. ?  ?mometasone-formoterol 100-5 MCG/ACT Aero ?Commonly known as: DULERA ?Inhale 2 puffs into the lungs 2 (two) times daily. ?  ?nicotine 21 mg/24hr patch ?Commonly known as: NICODERM CQ - dosed in mg/24 hours ?Place 1 patch (21 mg total) onto the skin daily. ?  ? ?  ? ? Follow-up Information   ? ? [place], [employer]. Schedule an appointment as soon as possible for a visit in 1 week(s).   ?Specialty: Family Medicine ?Contact information: ?[address] ?[address] ?[place] [address] ?[phone] ? ? ?  ?  ? ? Tanda Rockers, MD. Go on 07/14/2021.   ?Specialty: Pulmonary Disease ?Contact information: ?[address] ?[address] ?[place] [address] ?[phone] ? ? ?  ?  ? ?  ?  ? ?  ? ?Discharge Exam: ?Filed Weights  ? 07/08/21 0754 07/08/21 1828  ?Weight: 65.8 kg 65 kg  ? ?Examination: ?Physical Exam: ? ?Constitutional: WN/WD, NAD and appears calm and comfortable ?Eyes: PERRL, lids and conjunctivae normal, sclerae anicteric  ?ENMT: External Ears, Nose appear normal. Grossly normal hearing. Mucous membranes are moist. Posterior pharynx clear of any exudate or lesions. Normal dentition.  ?Neck: Appears normal, supple, no cervical masses, normal ROM, no appreciable thyromegaly ?Respiratory: Clear to auscultation bilaterally, no wheezing, rales, rhonchi or crackles. Normal respiratory effort and patient is not tachypenic. No accessory muscle use.  Currently on 6 L nasal cannula. ?Cardiovascular: RRR, no murmurs / rubs / gallops. S1 and S2 auscultated. No extremity edema. 2+ pedal pulses. No carotid bruits.  ?Abdomen: Soft, non-tender, non-distended. No masses palpated. No  appreciable hepatosplenomegaly. Bowel sounds positive.  ?GU: Deferred. ?Musculoskeletal: No clubbing / cyanosis of digits/nails. No joint deformity upper and lower extremities. Good ROM, no contractures. Normal strength and muscle tone.  ?Skin: No rashes, lesions, ulcers. No induration; Warm and dry.  ?Neurologic: CN 2-12 grossly intact with no focal deficits. Sensation intact in all 4 Extremities, DTR normal. Strength 5/5 in all 4. Romberg sign cerebellar reflexes not assessed.  ?Psychiatric: Normal judgment and insight. Alert and oriented x 3. Normal mood and appropriate affect.  ? ? ?Condition at discharge: good ? ?The results of significant diagnostics from this hospitalization (including imaging, microbiology, ancillary and laboratory) are listed below for reference.  ? ?Imaging Studies: ?DG Chest 2 View ? ?Result Date: 07/11/2021 ?CLINICAL DATA:  Right side pneumothorax/htn/smoker EXAM: CHEST - 2 VIEW COMPARISON:  July 10, 2021. FINDINGS: Right-sided hydropneumothorax. Gas component is unchanged. Interval development of a small fluid component. Linear opacities in the medial aspect of the right lower lobe, similar and likely atelectasis. No confluent consolidation. No visible pleural effusions. Right clavicular fixation. IMPRESSION: 1. Right-sided hydropneumothorax. Gas component is unchanged. Interval development of a small fluid  component. 2. Similar subsegmental atelectasis in the right lung base. Electronically Signed   By: Margaretha Sheffield M.D.   On: 07/11/2021 08:06  ? ?DG Chest 2 View ? ?Result Date: 07/09/2021 ?CLINICAL DATA:  Right pneumothorax. EXAM: CHEST - 2 VIEW COMPARISON:  07/08/2021 and CT chest 01/13/2009. FINDINGS: Trachea is midline. Heart size normal. Lungs are hyperinflated with biapical bullous emphysema. Moderate right pneumothorax and small right pleural effusion appear similar to minimally improved. Right basilar subsegmental atelectasis. Left lung is clear. No left pleural fluid.  Plate and screw fixation of the right clavicle. IMPRESSION: 1. Moderate right hydropneumothorax, possibly minimally improved from 07/08/2021. 2. Right basilar subsegmental atelectasis. Electronically Signed   By

## 2021-07-11 NOTE — Telephone Encounter (Signed)
Scheduled pt with Dr. Vassie Loll on Monday 07/14/21 at 3:30. Pt will have x-ray on Monday morning according to pt.  ?Routing to Dr. Vassie Loll as Lorain Childes. Referral placed with Dr. Serena Croissant because of availability and urgency. Pt mentioned that he would need  a CXR on Monday would you like [place] to go ahead and place the CXR. Pt is planning on having ran Monday morning.  ? ?Routing to Dr. Sherene Sires and Dr. Vassie Loll as Lorain Childes  ?

## 2021-07-14 ENCOUNTER — Ambulatory Visit (INDEPENDENT_AMBULATORY_CARE_PROVIDER_SITE_OTHER): Payer: Medicare HMO | Admitting: Pulmonary Disease

## 2021-07-14 ENCOUNTER — Other Ambulatory Visit: Payer: Self-pay

## 2021-07-14 ENCOUNTER — Ambulatory Visit (HOSPITAL_COMMUNITY)
Admission: RE | Admit: 2021-07-14 | Discharge: 2021-07-14 | Disposition: A | Discharge status: Home / Self Care | Payer: Medicare HMO | Source: Ambulatory Visit | Attending: Pulmonary Disease | Admitting: Pulmonary Disease

## 2021-07-14 ENCOUNTER — Encounter: Payer: Self-pay | Admitting: Pulmonary Disease

## 2021-07-14 ENCOUNTER — Telehealth: Payer: Self-pay | Admitting: Pulmonary Disease

## 2021-07-14 VITALS — BP 116/74 | HR 69 | Temp 97.9°F | Ht 70.0 in | Wt 143.6 lb

## 2021-07-14 DIAGNOSIS — J9312 Secondary spontaneous pneumothorax: Secondary | ICD-10-CM | POA: Diagnosis not present

## 2021-07-14 DIAGNOSIS — J432 Centrilobular emphysema: Secondary | ICD-10-CM

## 2021-07-14 DIAGNOSIS — J939 Pneumothorax, unspecified: Secondary | ICD-10-CM

## 2021-07-14 MED ORDER — ANORO ELLIPTA 62.5-25 MCG/ACT IN AEPB: 1.0000 | INHALATION_SPRAY | Freq: Every day | RESPIRATORY_TRACT | 0 refills | Status: DC

## 2021-07-14 NOTE — Patient Instructions (Addendum)
?  Pneumothorax looks decreased ? ?Finish dulera ?X sample of Anoro ? ?X FU next week @ [facility] with CXR ? ? ?We discussed surgery ?

## 2021-07-14 NOTE — Progress Notes (Signed)
? ?Subjective:  ? ? Patient ID: Joel Salazar, male    DOB: 1970/07/25, 51 y.o.   MRN: [id number] ? ?HPI ? ?51 year old smoker from [place] presents for evaluation of spontaneous pneumothorax ?He developed difficulty breathing and chest pain and waited 2 to 3 days before seeking attention on 07/08/2021, chest x-ray showed moderate sized right-sided pneumothorax.  He was hospitalized for 4 days and followed by serial chest x-rays which showed mild improvement.  Last chest x-ray from 3/24 which I independently reviewed shows small fluid component and improving pneumothorax. ?He reports that his breathing is improved, denies chest pain.  He arrives accompanied by his girlfriend Robin.  He was smoking about 2 packs/day since age 85, more than 60 pack years.  He has quit smoking completely now. ?He works as a Music therapist on the side, disabled after falling off a horse with multiple injuries ? ?Family history -dad had emphysema and was on oxygen and died of COPD, mother had emphysema ? ? ?Significant tests/ events reviewed ? ?CT chest without contrast 07/10/2018 centrilobular emphysema with apical blebs, moderate right pneumothorax consolidation right lower lobe ? ? ? ?Past Medical History:  ?Diagnosis Date  ? Arthritis   ? Hypertension   ? Shortness of breath dyspnea   ? due to rib fractures  ? ? ?Past Surgical History:  ?Procedure Laterality Date  ? ACETABULUM FRACTURE SURGERY Left   ? APPENDECTOMY    ? BACK SURGERY    ? Disectomy  ? FEMUR SURGERY Right   ? rod placed  ? ORIF CLAVICULAR FRACTURE Right 01/14/2016  ? Procedure: OPEN REDUCTION INTERNAL FIXATION (ORIF) CLAVICULAR FRACTURE;  Surgeon: Jones Broom, MD;  Location: [facility] OR;  Service: Orthopedics;  Laterality: Right;  ? ? ?No Known Allergies ? ?Social History  ? ?Socioeconomic History  ? Marital status: Single  ?  Spouse name: Not on file  ? Number of children: Not on file  ? Years of education: Not on file  ? Highest education level: Not on file  ?Occupational  History  ? Not on file  ?Tobacco Use  ? Smoking status: Every Day  ?  Packs/day: 2.00  ?  Years: 22.00  ?  Pack years: 44.00  ?  Types: Cigarettes  ? Smokeless tobacco: Never  ? Tobacco comments:  ?  Has not smoked since 07/08/21. ARJ 07/14/21  ?Vaping Use  ? Vaping Use: Never used  ?Substance and Sexual Activity  ? Alcohol use: Yes  ?  Comment: 4 or 5 beers/night  ? Drug use: Not Currently  ?  Frequency: 3.0 times per week  ?  Types: Marijuana  ? Sexual activity: Not on file  ?Other Topics Concern  ? Not on file  ?Social History Narrative  ? Not on file  ? ?Social Determinants of Health  ? ?Financial Resource Strain: Not on file  ?Food Insecurity: Not on file  ?Transportation Needs: Not on file  ?Physical Activity: Not on file  ?Stress: Not on file  ?Social Connections: Not on file  ?Intimate Partner Violence: Not on file  ? ? ? ? ? ?Review of Systems ?Shortness of breath with activity ?Acid heartburn indigestion ? ?Constitutional: negative for anorexia, fevers and sweats  ?Eyes: negative for irritation, redness and visual disturbance  ?Ears, nose, mouth, throat, and face: negative for earaches, epistaxis, nasal congestion and sore throat  ?Respiratory: negative for cough, sputum and wheezing  ?Cardiovascular: negative for chest pain, lower extremity edema, orthopnea, palpitations and syncope  ?Gastrointestinal: negative for abdominal  pain, constipation, diarrhea, melena, nausea and vomiting  ?Genitourinary:negative for dysuria, frequency and hematuria  ?Hematologic/lymphatic: negative for bleeding, easy bruising and lymphadenopathy  ?Musculoskeletal:negative for arthralgias, muscle weakness and stiff joints  ?Neurological: negative for coordination problems, gait problems, headaches and weakness  ?Endocrine: negative for diabetic symptoms including polydipsia, polyuria and weight loss ? ?   ?Objective:  ? Physical Exam ? ?Gen. Pleasant, thin man, in no distress, normal affect ?ENT - no pallor,icterus, no post nasal  drip ?Neck: No JVD, no thyromegaly, no carotid bruits ?Lungs: no use of accessory muscles, no dullness to percussion, decreased breath sounds on right without rales or rhonchi  ?Cardiovascular: Rhythm regular, heart sounds  normal, no murmurs or gallops, no peripheral edema ?Abdomen: soft and non-tender, no hepatosplenomegaly, BS normal. ?Musculoskeletal: No deformities, no cyanosis or clubbing ?Neuro:  alert, non focal ? ? ? ?   ?Assessment & Plan:  ? ? ?

## 2021-07-14 NOTE — Assessment & Plan Note (Signed)
He does have secondary spontaneous pneumothorax.  Chest x-ray was obtained and reviewed today which shows improvement with residual pneumothorax about 10% and very small fluid component.  This may have been triggered by an infection or simply coughing. ?Since this was spontaneous and CT shows apical blebs, I offered him bullectomy with pleurodesis as a preventive option.  He would like to wait and see how this episode resolves.  I explained risk of recurrence in the future ?

## 2021-07-14 NOTE — Telephone Encounter (Signed)
Spoke to [place] with [place] [facility]. She stated that CXR has been ordered and performed.  ?Nothing further needed.  ? ?

## 2021-07-14 NOTE — Assessment & Plan Note (Signed)
He has severe centrilobular emphysema with apical blebs. ?We will need PFT estimation in the future. ?For now he has Elwin Sleight that he received at the hospital, this is very expensive as outpatient.  We will give him a sample of Anoro in the meantime and reassess need for LAMA/LABA in the future ?

## 2021-07-15 DIAGNOSIS — J9383 Other pneumothorax: Secondary | ICD-10-CM | POA: Diagnosis not present

## 2021-07-15 DIAGNOSIS — Z6820 Body mass index (BMI) 20.0-20.9, adult: Secondary | ICD-10-CM | POA: Diagnosis not present

## 2021-07-15 DIAGNOSIS — J439 Emphysema, unspecified: Secondary | ICD-10-CM | POA: Diagnosis not present

## 2021-07-18 ENCOUNTER — Ambulatory Visit (HOSPITAL_COMMUNITY)
Admission: RE | Admit: 2021-07-18 | Discharge: 2021-07-18 | Disposition: A | Discharge status: Home / Self Care | Payer: Medicare HMO | Source: Ambulatory Visit | Attending: Pulmonary Disease | Admitting: Pulmonary Disease

## 2021-07-18 DIAGNOSIS — J939 Pneumothorax, unspecified: Secondary | ICD-10-CM | POA: Diagnosis not present

## 2021-07-29 ENCOUNTER — Ambulatory Visit: Payer: Medicare HMO | Admitting: Pulmonary Disease

## 2021-07-29 ENCOUNTER — Ambulatory Visit (HOSPITAL_COMMUNITY)
Admission: RE | Admit: 2021-07-29 | Discharge: 2021-07-29 | Disposition: A | Discharge status: Home / Self Care | Payer: Medicare HMO | Source: Ambulatory Visit | Attending: Pulmonary Disease | Admitting: Pulmonary Disease

## 2021-07-29 ENCOUNTER — Encounter: Payer: Self-pay | Admitting: Pulmonary Disease

## 2021-07-29 VITALS — BP 132/78 | HR 76 | Temp 98.6°F | Ht 71.0 in | Wt 153.0 lb

## 2021-07-29 DIAGNOSIS — J9312 Secondary spontaneous pneumothorax: Secondary | ICD-10-CM

## 2021-07-29 DIAGNOSIS — J939 Pneumothorax, unspecified: Secondary | ICD-10-CM | POA: Diagnosis not present

## 2021-07-29 DIAGNOSIS — J432 Centrilobular emphysema: Secondary | ICD-10-CM

## 2021-07-29 DIAGNOSIS — Z720 Tobacco use: Secondary | ICD-10-CM | POA: Diagnosis not present

## 2021-07-29 NOTE — Assessment & Plan Note (Signed)
Smoking cessation was again discussed in detail.  He is at high risk for relapse ?

## 2021-07-29 NOTE — Progress Notes (Signed)
? ?  Subjective:  ? ? Patient ID: Joel Salazar, male    DOB: 07/12/1970, 51 y.o.   MRN: [id number] ? ?HPI ? ?50-y o smoker for FU of spontaneous pneumothorax ?He smoked about 2 packs/day since age 37, more than 60 pack years ? ?Chief Complaint  ?Patient presents with  ? Follow-up  ?  2 week follow up   ? ? ?He is close to his baseline.  He reports an occasional ache on his right side but no pain, breathing is okay, coughing has subsided. 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  He has not tried the Anoro that we gave him on his last visit. ?He quit smoking on 07/08/2021 and has not picked up a cigarette again ? ?Significant tests/ events reviewed ?CT chest without contrast 07/10/2018 centrilobular emphysema with apical blebs, moderate right pneumothorax consolidation right lower lobe ? ?Review of Systems ?neg for any significant sore throat, dysphagia, itching, sneezing, nasal congestion or excess/ purulent secretions, fever, chills, sweats, unintended wt loss, pleuritic or exertional cp, hempoptysis, orthopnea pnd or change in chronic leg swelling. Also denies presyncope, palpitations, heartburn, abdominal pain, nausea, vomiting, diarrhea or change in bowel or urinary habits, dysuria,hematuria, rash, arthralgias, visual complaints, headache, numbness weakness or ataxia. ? ?   ?Objective:  ? Physical Exam ? ? ?Gen. Pleasant, well-nourished, in no distress ?ENT - no thrush, no pallor/icterus,no post nasal drip ?Neck: No JVD, no thyromegaly, no carotid bruits ?Lungs: no use of accessory muscles, no dullness to percussion, decreased bilateral without rales or rhonchi  ?Cardiovascular: Rhythm regular, heart sounds  normal, no murmurs or gallops, no peripheral edema ?Musculoskeletal: No deformities, no cyanosis or clubbing  ? ? ? ? ?   ?Assessment & Plan:  ? ? ?

## 2021-07-29 NOTE — Patient Instructions (Signed)
?  X CXR today follow pneumothorax ? ?X PFTs in 3 months ?

## 2021-07-29 NOTE — Assessment & Plan Note (Signed)
He has bullous emphysema.  Baseline dyspnea is acceptable to him and does not want to take inhalers. ?We will assess PFTs in 3 months after the pneumothorax is fully subsided ?

## 2021-07-29 NOTE — Addendum Note (Signed)
Addended by: Carleene Mains D on: 07/29/2021 09:06 AM ? ? Modules accepted: Orders ? ?

## 2021-07-29 NOTE — Assessment & Plan Note (Signed)
Pneumothorax has resolved.  I reviewed last chest x-ray from 3/31, what is being read as an apical pneumothorax is actually bullous emphysema.  Lung appears to be fully expanded and he is close to back to his baseline. ?I again offered him colectomy with pleurodesis as an elective procedure, I discussed 10 to 20% chance of recurrence in the future.  He would like to hold off.  I discussed signs and symptoms of recurrent pneumothorax for which he needs to seek attention emergently ?

## 2021-08-11 ENCOUNTER — Institutional Professional Consult (permissible substitution): Payer: Medicare HMO | Admitting: Internal Medicine

## 2021-11-28 ENCOUNTER — Emergency Department (HOSPITAL_COMMUNITY)
Admission: EM | Admit: 2021-11-28 | Discharge: 2021-11-28 | Disposition: A | Discharge status: Home / Self Care | Payer: Medicare HMO | Attending: Emergency Medicine | Admitting: Emergency Medicine

## 2021-11-28 ENCOUNTER — Encounter (HOSPITAL_COMMUNITY): Payer: Self-pay

## 2021-11-28 ENCOUNTER — Emergency Department (HOSPITAL_COMMUNITY): Payer: Medicare HMO

## 2021-11-28 ENCOUNTER — Other Ambulatory Visit: Payer: Self-pay

## 2021-11-28 DIAGNOSIS — Y93E1 Activity, personal bathing and showering: Secondary | ICD-10-CM | POA: Insufficient documentation

## 2021-11-28 DIAGNOSIS — S2241XA Multiple fractures of ribs, right side, initial encounter for closed fracture: Secondary | ICD-10-CM | POA: Diagnosis not present

## 2021-11-28 DIAGNOSIS — S20211A Contusion of right front wall of thorax, initial encounter: Secondary | ICD-10-CM | POA: Diagnosis not present

## 2021-11-28 DIAGNOSIS — W010XXA Fall on same level from slipping, tripping and stumbling without subsequent striking against object, initial encounter: Secondary | ICD-10-CM | POA: Diagnosis not present

## 2021-11-28 DIAGNOSIS — S299XXA Unspecified injury of thorax, initial encounter: Secondary | ICD-10-CM | POA: Diagnosis not present

## 2021-11-28 DIAGNOSIS — Y92002 Bathroom of unspecified non-institutional (private) residence single-family (private) house as the place of occurrence of the external cause: Secondary | ICD-10-CM | POA: Insufficient documentation

## 2021-11-28 NOTE — Discharge Instructions (Signed)
You were seen in the emergency department for right-sided rib pain after a fall.  Your chest x-ray did not show any pneumothorax or dropped lung.  No obvious rib fractures.  Please use Tylenol and ibuprofen for pain.  Follow-up with your regular doctor.  Return to the emergency department if any worsening or concerning symptoms.

## 2021-11-28 NOTE — ED Triage Notes (Signed)
Patient reports slipping in shower yesterday landing on right side. Complaining of pain to this area. States that he had collapsed lung in past and this feels similar. Slightly short of breath.

## 2021-11-28 NOTE — ED Provider Notes (Signed)
[facility] EMERGENCY DEPARTMENT Provider Note   CSN: [id number] Arrival date & time: 11/28/21  0731     History  Chief Complaint  Patient presents with   Rib Injury    Joel Salazar is a 51 y.o. male.  He is here with complaint of right lateral rib pain after he fell in the shower a couple of days ago.  Worse with coughing deep breathing twisting and turning.  He feels a little bit short of breath at times.  He says this feels like when he had a spontaneous pneumothorax.  No other injuries or complaints.  No loss of consciousness.  He is a smoker.  The history is provided by the patient.  Chest Pain Pain location:  R lateral chest Pain quality: stabbing   Pain severity:  Severe Onset quality:  Sudden Duration:  2 days Timing:  Constant Progression:  Unchanged Chronicity:  Recurrent Context: trauma   Relieved by:  Nothing Worsened by:  Deep breathing, movement and coughing Ineffective treatments:  None tried Associated symptoms: shortness of breath   Associated symptoms: no abdominal pain, no fever, no nausea and no vomiting   Risk factors: smoking        Home Medications Prior to Admission medications   Medication Sig Start Date End Date Taking? Authorizing Provider  cloNIDine (CATAPRES) 0.1 MG tablet Take 1 tablet (0.1 mg total) by mouth 2 (two) times daily. 07/11/21   Sherryll Burger, Pratik D, DO  cyclobenzaprine (FLEXERIL) 10 MG tablet Take 1 tablet (10 mg total) by mouth 2 (two) times daily as needed. Patient taking differently: Take 10 mg by mouth 2 (two) times daily as needed for muscle spasms. 06/22/19   Donnetta Hutching, MD  mometasone-formoterol [facility]) 100-5 MCG/ACT AERO Inhale 2 puffs into the lungs 2 (two) times daily. 07/11/21 08/10/21  Sherryll Burger, Pratik D, DO  nicotine (NICODERM CQ - DOSED IN MG/24 HOURS) 21 mg/24hr patch Place 1 patch (21 mg total) onto the skin daily. 07/11/21   Sherryll Burger, Pratik D, DO  umeclidinium-vilanterol (ANORO ELLIPTA) 62.5-25 MCG/ACT AEPB Inhale 1 puff  into the lungs daily. 07/14/21   Oretha Milch, MD      Allergies    Patient has no known allergies.    Review of Systems   Review of Systems  Constitutional:  Negative for fever.  Respiratory:  Positive for shortness of breath.   Cardiovascular:  Positive for chest pain.  Gastrointestinal:  Negative for abdominal pain, nausea and vomiting.    Physical Exam Updated Vital Signs BP (!) 164/97   Pulse 91   Temp 97.8 F (36.6 C) (Oral)   Resp 18   Wt 65.8 kg   SpO2 100%   BMI 20.22 kg/m  Physical Exam Vitals and nursing note reviewed.  Constitutional:      General: He is not in acute distress.    Appearance: Normal appearance. He is well-developed.  HENT:     Head: Normocephalic and atraumatic.  Eyes:     Conjunctiva/sclera: Conjunctivae normal.  Cardiovascular:     Rate and Rhythm: Normal rate and regular rhythm.     Heart sounds: No murmur heard. Pulmonary:     Effort: Pulmonary effort is normal. No respiratory distress.     Breath sounds: Normal breath sounds.  Chest:     Chest wall: Tenderness (right lateral - no bruising or crepitus) present.  Abdominal:     Palpations: Abdomen is soft.     Tenderness: There is no abdominal tenderness. There is  no guarding or rebound.  Musculoskeletal:        General: Normal range of motion.     Cervical back: Neck supple.     Right lower leg: No edema.     Left lower leg: No edema.  Skin:    General: Skin is warm and dry.     Capillary Refill: Capillary refill takes less than 2 seconds.  Neurological:     General: No focal deficit present.     Mental Status: He is alert.     Gait: Gait normal.     ED Results / Procedures / Treatments   Labs (all labs ordered are listed, but only abnormal results are displayed) Labs Reviewed - No data to display  EKG None  Radiology DG Ribs Unilateral W/Chest Right  Result Date: 11/28/2021 CLINICAL DATA:  Right rib pain after fall 2 days ago. EXAM: RIGHT RIBS AND CHEST - 3+ VIEW  COMPARISON:  July 29, 2021. FINDINGS: Old right seventh rib fracture is noted. No acute rib fracture is noted. There is no evidence of pneumothorax or pleural effusion. Both lungs are clear. Heart size and mediastinal contours are within normal limits. IMPRESSION: Old right seventh rib fracture.  No acute fracture is noted. Electronically Signed   By: Lupita Raider M.D.   On: 11/28/2021 08:14    Procedures Procedures    Medications Ordered in ED Medications - No data to display  ED Course/ Medical Decision Making/ A&P Clinical Course as of 11/28/21 1918  Fri Nov 28, 2021  0160 Chest x-ray interpreted by me as no acute pneumothorax.  Awaiting radiology reading. [MB]    Clinical Course User Index [MB] Terrilee Files, MD                           Medical Decision Making Amount and/or Complexity of Data Reviewed Radiology: ordered.   Differential diagnosis includes contusion, fracture, pneumothorax.  Chest x-ray ordered interpreted by me as no acute pneumothorax.  Reviewed with patient.  Recommended symptomatic treatment at home.  Return instructions discussed no indications for admission or further testing at this time.        Final Clinical Impression(s) / ED Diagnoses Final diagnoses:  Contusion of right chest wall, initial encounter    Rx / DC Orders ED Discharge Orders     None         Terrilee Files, MD 11/28/21 1918

## 2021-12-28 ENCOUNTER — Other Ambulatory Visit: Payer: Self-pay

## 2021-12-28 ENCOUNTER — Emergency Department (HOSPITAL_COMMUNITY)
Admission: EM | Admit: 2021-12-28 | Discharge: 2021-12-28 | Disposition: A | Discharge status: Home / Self Care | Payer: Medicare HMO | Attending: Emergency Medicine | Admitting: Emergency Medicine

## 2021-12-28 ENCOUNTER — Encounter (HOSPITAL_COMMUNITY): Payer: Self-pay

## 2021-12-28 DIAGNOSIS — L0291 Cutaneous abscess, unspecified: Secondary | ICD-10-CM

## 2021-12-28 DIAGNOSIS — L0211 Cutaneous abscess of neck: Secondary | ICD-10-CM | POA: Insufficient documentation

## 2021-12-28 DIAGNOSIS — L720 Epidermal cyst: Secondary | ICD-10-CM | POA: Diagnosis not present

## 2021-12-28 DIAGNOSIS — Z79899 Other long term (current) drug therapy: Secondary | ICD-10-CM | POA: Diagnosis not present

## 2021-12-28 DIAGNOSIS — I10 Essential (primary) hypertension: Secondary | ICD-10-CM | POA: Diagnosis not present

## 2021-12-28 MED ORDER — OXYCODONE-ACETAMINOPHEN 5-325 MG PO TABS
1.0000 | ORAL_TABLET | Freq: Once | ORAL | Status: AC
  Administered 2021-12-28: 1 via ORAL
  Filled 2021-12-28: qty 1

## 2021-12-28 MED ORDER — LIDOCAINE HCL (PF) 1 % IJ SOLN
5.0000 mL | Freq: Once | INTRAMUSCULAR | Status: AC
  Administered 2021-12-28: 5 mL
  Filled 2021-12-28: qty 5

## 2021-12-28 MED ORDER — DOXYCYCLINE HYCLATE 100 MG PO CAPS: 100.0000 mg | ORAL_CAPSULE | Freq: Two times a day (BID) | ORAL | 0 refills | Status: DC

## 2021-12-28 NOTE — ED Provider Notes (Signed)
[facility] EMERGENCY DEPARTMENT Provider Note   CSN: [id number] Arrival date & time: 12/28/21  1133     History  Chief Complaint  Patient presents with   Abscess    Joel Salazar is a 51 y.o. male with past medical history significant for hypertension presents with concern for abscess versus cyst on the left side of the neck.  Patient reports that has been there for several weeks to months but has never become irritated before.  Patient reports that has become irritated inflamed over the last few days and started draining on its own some today.  Patient reports that has never been drained or lanced before.  He denies any fever, chills, or other areas of cystic drainage today.   Abscess      Home Medications Prior to Admission medications   Medication Sig Start Date End Date Taking? Authorizing Provider  doxycycline (VIBRAMYCIN) 100 MG capsule Take 1 capsule (100 mg total) by mouth 2 (two) times daily. 12/28/21  Yes Asyia Hornung H, PA-C  cloNIDine (CATAPRES) 0.1 MG tablet Take 1 tablet (0.1 mg total) by mouth 2 (two) times daily. 07/11/21   Sherryll Burger, Pratik D, DO  cyclobenzaprine (FLEXERIL) 10 MG tablet Take 1 tablet (10 mg total) by mouth 2 (two) times daily as needed. Patient taking differently: Take 10 mg by mouth 2 (two) times daily as needed for muscle spasms. 06/22/19   Donnetta Hutching, MD  mometasone-formoterol [facility]) 100-5 MCG/ACT AERO Inhale 2 puffs into the lungs 2 (two) times daily. 07/11/21 08/10/21  Sherryll Burger, Pratik D, DO  nicotine (NICODERM CQ - DOSED IN MG/24 HOURS) 21 mg/24hr patch Place 1 patch (21 mg total) onto the skin daily. 07/11/21   Sherryll Burger, Pratik D, DO  umeclidinium-vilanterol (ANORO ELLIPTA) 62.5-25 MCG/ACT AEPB Inhale 1 puff into the lungs daily. 07/14/21   Oretha Milch, MD      Allergies    Patient has no known allergies.    Review of Systems   Review of Systems  All other systems reviewed and are negative.   Physical Exam Updated Vital Signs BP (!)  149/83 (BP Location: Right Arm)   Pulse 89   Temp 97.6 F (36.4 C) (Oral)   Resp 16   Ht 5\' 11"  (1.803 m)   Wt 65.8 kg   SpO2 98%   BMI 20.22 kg/m  Physical Exam Vitals and nursing note reviewed.  Constitutional:      General: He is not in acute distress.    Appearance: Normal appearance.  HENT:     Head: Normocephalic and atraumatic.  Eyes:     General:        Right eye: No discharge.        Left eye: No discharge.  Cardiovascular:     Rate and Rhythm: Normal rate and regular rhythm.  Pulmonary:     Effort: Pulmonary effort is normal. No respiratory distress.  Musculoskeletal:        General: No deformity.  Skin:    General: Skin is warm and dry.     Comments: 3 and half centimeter fluid-filled cyst with some surrounding cellulitis noted on left side of the neck.  There is active purulent drainage coming from cyst.  Neurological:     Mental Status: He is alert and oriented to person, place, and time.  Psychiatric:        Mood and Affect: Mood normal.        Behavior: Behavior normal.     ED Results /  Procedures / Treatments   Labs (all labs ordered are listed, but only abnormal results are displayed) Labs Reviewed - No data to display  EKG None  Radiology No results found.  Procedures .Marland KitchenIncision and Drainage  Date/Time: 12/28/2021 1:39 PM  Performed by: Olene Floss, PA-C Authorized by: Olene Floss, PA-C   Consent:    Consent obtained:  Verbal   Consent given by:  Patient   Risks, benefits, and alternatives were discussed: yes     Risks discussed:  Bleeding, incomplete drainage, infection, pain and damage to other organs   Alternatives discussed:  No treatment Universal protocol:    Procedure explained and questions answered to patient or proxy's satisfaction: yes     Patient identity confirmed:  Verbally with patient Location:    Type:  Cyst   Size:  3.5cm   Location:  Neck   Neck location:  L anterior Pre-procedure details:     Skin preparation:  Povidone-iodine Sedation:    Sedation type:  None Anesthesia:    Anesthesia method:  Local infiltration   Local anesthetic:  Lidocaine 1% w/o epi Procedure type:    Complexity:  Simple Procedure details:    Ultrasound guidance: yes     Incision types:  Single straight   Incision depth:  Dermal   Wound management:  Probed and deloculated   Drainage:  Purulent and bloody   Drainage amount:  Moderate   Wound treatment:  Wound left open   Packing materials:  None Post-procedure details:    Procedure completion:  Tolerated     Medications Ordered in ED Medications  lidocaine (PF) (XYLOCAINE) 1 % injection 5 mL (5 mLs Infiltration Given 12/28/21 1320)  oxyCODONE-acetaminophen (PERCOCET/ROXICET) 5-325 MG per tablet 1 tablet (1 tablet Oral Given 12/28/21 1327)    ED Course/ Medical Decision Making/ A&P                           Medical Decision Making  This is an overall well-appearing 51 year old male who presents with concern for left neck abscess versus cyst.  On exam patient with evidence of an infected epidermal inclusion cyst.  This agrees with his history that it has been present for many months to even possibly over a year this location is only to start bothering him recently.  Clinically there is signs of some surrounding cellulitis and a well demarcated cystic collection on the left anterior neck.  I performed incision and drainage of the site as discussed above.  Discussed with the patient that this will help relieve the pressure, we will treat for surrounding skin infection, however discussed that since there was a cystic collection prior to the incision and drainage the cyst may close back to the antibiotics.  He will back up with fluid.  He wants the entire lesion to be removed he needs to have it removed by dermatologist or surgeon.  Patient understands and agrees to plan, discharged in stable condition at this time.  Tolerated the procedure without  difficulty.  Reports relief of pain after drainage.  Will discharge on doxycycline. Final Clinical Impression(s) / ED Diagnoses Final diagnoses:  Abscess  Epidermal inclusion cyst    Rx / DC Orders ED Discharge Orders          Ordered    doxycycline (VIBRAMYCIN) 100 MG capsule  2 times daily        12/28/21 1339  Dorien Chihuahua 12/28/21 [place], Ankit, MD 12/30/21 1224

## 2021-12-28 NOTE — Discharge Instructions (Addendum)
As we discussed you had an epidermal inclusion cyst that became infected and formed an abscess, I have left it open to drain over the next few days, and place you on antibiotics to cover for surrounding skin infection.  If you notice that the fluid collection is returning, is becoming more painful please follow-up with a dermatologist or surgeon for full removal of the epidermal inclusion cyst.  Please use Tylenol or ibuprofen for pain.  You may use 600 mg ibuprofen every 6 hours or 1000 mg of Tylenol every 6 hours.  You may choose to alternate between the 2.  This would be most effective.  Not to exceed 4 g of Tylenol within 24 hours.  Not to exceed 3200 mg ibuprofen 24 hours.

## 2021-12-28 NOTE — ED Triage Notes (Signed)
Pt presents to ED with complaints of abscess on the left side of his neck. Pt states has been draining some.

## 2022-08-27 DIAGNOSIS — E782 Mixed hyperlipidemia: Secondary | ICD-10-CM | POA: Diagnosis not present

## 2022-08-27 DIAGNOSIS — Z1331 Encounter for screening for depression: Secondary | ICD-10-CM | POA: Diagnosis not present

## 2022-08-27 DIAGNOSIS — F411 Generalized anxiety disorder: Secondary | ICD-10-CM | POA: Diagnosis not present

## 2022-08-27 DIAGNOSIS — Z125 Encounter for screening for malignant neoplasm of prostate: Secondary | ICD-10-CM | POA: Diagnosis not present

## 2022-08-27 DIAGNOSIS — Z0001 Encounter for general adult medical examination with abnormal findings: Secondary | ICD-10-CM | POA: Diagnosis not present

## 2022-08-27 DIAGNOSIS — I10 Essential (primary) hypertension: Secondary | ICD-10-CM | POA: Diagnosis not present

## 2022-08-27 DIAGNOSIS — Z6820 Body mass index (BMI) 20.0-20.9, adult: Secondary | ICD-10-CM | POA: Diagnosis not present

## 2022-08-27 DIAGNOSIS — J439 Emphysema, unspecified: Secondary | ICD-10-CM | POA: Diagnosis not present

## 2022-09-09 ENCOUNTER — Encounter: Payer: Self-pay | Admitting: *Deleted

## 2022-10-13 DIAGNOSIS — N522 Drug-induced erectile dysfunction: Secondary | ICD-10-CM | POA: Diagnosis not present

## 2022-10-13 DIAGNOSIS — I10 Essential (primary) hypertension: Secondary | ICD-10-CM | POA: Diagnosis not present

## 2022-10-13 DIAGNOSIS — J439 Emphysema, unspecified: Secondary | ICD-10-CM | POA: Diagnosis not present

## 2022-10-13 DIAGNOSIS — F411 Generalized anxiety disorder: Secondary | ICD-10-CM | POA: Diagnosis not present

## 2022-10-13 DIAGNOSIS — F17200 Nicotine dependence, unspecified, uncomplicated: Secondary | ICD-10-CM | POA: Diagnosis not present

## 2022-10-13 DIAGNOSIS — Z681 Body mass index (BMI) 19 or less, adult: Secondary | ICD-10-CM | POA: Diagnosis not present

## 2022-10-19 ENCOUNTER — Telehealth: Payer: Self-pay | Admitting: Internal Medicine

## 2022-10-19 NOTE — Telephone Encounter (Signed)
Patient dropped off his questionnaire and is in review.

## 2022-10-20 ENCOUNTER — Telehealth: Payer: Self-pay | Admitting: *Deleted

## 2022-10-20 NOTE — Telephone Encounter (Signed)
  Procedure: colonoscopy  Height: 5'11" Weight: 145 lb      BMI: 20.2  Have you had a colonoscopy before?  No  Do you have family history of colon cancer?  No  Do you have a family history of polyps? yes  Previous colonoscopy with polyps removed? no  Do you have a history colorectal cancer?   no  Are you diabetic?  no  Do you have a prosthetic or mechanical heart valve? no  Do you have a pacemaker/defibrillator?   no  Have you had endocarditis/atrial fibrillation?  no  Do you use supplemental oxygen/CPAP?  no  Have you had joint replacement within the last 12 months?  no  Do you tend to be constipated or have to use laxatives?  no   Do you have history of alcohol use? If yes, how much and how often.  no  Do you have history or are you using drugs? If yes, what do are you  using?  no  Have you ever had a stroke/heart attack?  no  Have you ever had a heart or other vascular stent placed,?no  Do you take weight loss medication? no   Do you take any blood-thinning medications such as: (Plavix, aspirin, Coumadin, Aggrenox, Brilinta, Xarelto, Eliquis, Pradaxa, Savaysa or Effient)? no  If yes we need the name, milligram, dosage and who is prescribing doctor:  n/a             Current Outpatient Medications  Medication Sig Dispense Refill   cloNIDine (CATAPRES) 0.1 MG tablet Take 1 tablet (0.1 mg total) by mouth 2 (two) times daily. 60 tablet 11   cyclobenzaprine (FLEXERIL) 10 MG tablet Take 1 tablet (10 mg total) by mouth 2 (two) times daily as needed. (Patient taking differently: Take 10 mg by mouth 2 (two) times daily as needed for muscle spasms.) 15 tablet 0   ESCITALOPRAM OXALATE PO Take 5 mg by mouth daily.     No current facility-administered medications for this visit.    No Known Allergies

## 2022-11-18 ENCOUNTER — Encounter (INDEPENDENT_AMBULATORY_CARE_PROVIDER_SITE_OTHER): Payer: Self-pay | Admitting: *Deleted

## 2022-11-18 ENCOUNTER — Other Ambulatory Visit: Payer: Self-pay | Admitting: *Deleted

## 2022-11-18 ENCOUNTER — Encounter: Payer: Self-pay | Admitting: *Deleted

## 2022-11-18 MED ORDER — PEG 3350-KCL-NA BICARB-NACL 420 G PO SOLR: 4000.0000 mL | Freq: Once | ORAL | 0 refills | Status: AC

## 2022-11-18 NOTE — Telephone Encounter (Signed)
Pt has been scheduled with Dr.Carver on 12/15/22. Instructions mailed and prep sent to the pharamcy.

## 2022-11-18 NOTE — Telephone Encounter (Signed)
Referral completed

## 2022-12-08 ENCOUNTER — Other Ambulatory Visit: Payer: Self-pay

## 2022-12-08 ENCOUNTER — Encounter: Payer: Self-pay | Admitting: Emergency Medicine

## 2022-12-08 ENCOUNTER — Ambulatory Visit
Admission: EM | Admit: 2022-12-08 | Discharge: 2022-12-08 | Disposition: A | Discharge status: Home / Self Care | Payer: Medicare HMO | Attending: Family Medicine | Admitting: Family Medicine

## 2022-12-08 DIAGNOSIS — B0052 Herpesviral keratitis: Secondary | ICD-10-CM | POA: Diagnosis not present

## 2022-12-08 MED ORDER — POLYMYXIN B-TRIMETHOPRIM 10000-0.1 UNIT/ML-% OP SOLN: 1.0000 [drp] | Freq: Four times a day (QID) | OPHTHALMIC | 0 refills | Status: DC

## 2022-12-08 MED ORDER — VALACYCLOVIR HCL 1 G PO TABS: 1000.0000 mg | ORAL_TABLET | Freq: Two times a day (BID) | ORAL | 0 refills | Status: DC

## 2022-12-08 MED ORDER — KETOROLAC TROMETHAMINE 30 MG/ML IJ SOLN
30.0000 mg | Freq: Once | INTRAMUSCULAR | Status: AC
  Administered 2022-12-08: 30 mg via INTRAMUSCULAR

## 2022-12-08 NOTE — ED Triage Notes (Signed)
Pt reports "I have a cold sore" in my left eye. Pt reports light sensitivity. Denies injury. Denies any corrective lenses at baseline. Pt reports " I can't even open it with the light on."

## 2022-12-08 NOTE — ED Provider Notes (Signed)
[facility]    CSN: [id number] Arrival date & time: 12/08/22  1024      History   Chief Complaint Chief Complaint  Patient presents with   Eye Problem    HPI Joel Salazar is a 52 y.o. male.   Patient presenting today with severe left eye pain since yesterday.  States redness, clear drainage, gritty foreign body sensation.  Denies injury to the eye or new chemical exposures, visual change, headache, nausea, vomiting, fever.  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  So far not tried anything over-the-counter for symptoms.  Does not currently have an ophthalmologist.    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 Authorizing Provider  trimethoprim-polymyxin b (POLYTRIM) ophthalmic solution Place 1 drop into the left eye every 6 (six) hours. 12/08/22  Yes Particia Nearing, PA-C  valACYclovir (VALTREX) 1000 MG tablet Take 1 tablet (1,000 mg total) by mouth 2 (two) times daily. 12/08/22  Yes Particia Nearing, PA-C  cloNIDine (CATAPRES) 0.1 MG tablet Take 1 tablet (0.1 mg total) by mouth 2 (two) times daily. 07/11/21   Sherryll Burger, Pratik  D, DO  cyclobenzaprine (FLEXERIL) 10 MG tablet Take 1 tablet (10 mg total) by mouth 2 (two) times daily as needed. Patient taking differently: Take 10 mg by mouth 2 (two) times daily as needed for muscle spasms. 06/22/19   Donnetta Hutching, MD  ESCITALOPRAM OXALATE PO Take 5 mg by mouth daily. Patient not taking: Reported on 12/08/2022    [provider]    Family History History reviewed. No pertinent family history.  Social History Social History   Tobacco Use   Smoking status: Every Day    Current packs/day: 2.00    Average packs/day: 2.0 packs/day for 22.0 years (44.0 ttl pk-yrs)    Types: Cigarettes   Smokeless tobacco: Never   Tobacco comments:    Has not smoked since 07/08/21. ARJ 07/14/21  Vaping Use   Vaping status: Never Used  Substance Use Topics   Alcohol use: Yes    Comment: 4 or 5 beers/night   Drug use: Not Currently    Frequency: 3.0 times per week    Types: Marijuana     Allergies   Patient has no known allergies.   Review of Systems Review of Systems Per HPI  Physical Exam Triage Vital Signs ED Triage Vitals  Encounter Vitals Group     BP 12/08/22 1048 103/70     Systolic BP Percentile --      Diastolic BP Percentile --      Pulse Rate  12/08/22 1048 (!) 112     Resp 12/08/22 1048 20     Temp 12/08/22 1048 97.8 F (36.6 C)     Temp Source 12/08/22 1048 Oral     SpO2 12/08/22 1048 98 %     Weight --      Height --      Head Circumference --      Peak Flow --      Pain Score 12/08/22 1047 10     Pain Loc --      Pain Education --      Exclude from Growth Chart --    No data found.  Updated Vital Signs BP 103/70 (BP Location: Right Arm)   Pulse (!) 112   Temp 97.8 F (36.6 C) (Oral)   Resp 20   SpO2 98%   Visual Acuity Right Eye Distance:   Left Eye Distance:   Bilateral Distance:    Right Eye Near:   Left Eye Near:    Bilateral Near:     Physical Exam Vitals and nursing note reviewed.  Constitutional:      Appearance:  Normal appearance.  HENT:     Head: Atraumatic.     Mouth/Throat:     Mouth: Mucous membranes are moist.     Pharynx: Oropharynx is clear.  Eyes:     Extraocular Movements: Extraocular movements intact.     Pupils: Pupils are equal, round, and reactive to light.     Comments: Left conjunctival erythema and injection.  Areas of increased uptake on fluorescein stain exam.  No foreign body noted I  Cardiovascular:     Rate and Rhythm: Normal rate and regular rhythm.  Pulmonary:     Effort: Pulmonary effort is normal.     Breath sounds: Normal breath sounds.  Musculoskeletal:        General: Normal range of motion.     Cervical back: Normal range of motion and neck supple.  Skin:    General: Skin is warm and dry.  Neurological:     General: No focal deficit present.     Mental Status: He is oriented to person, place, and time.  Psychiatric:        Mood and Affect: Mood normal.        Thought Content: Thought content normal.        Judgment: Judgment normal.      UC Treatments / Results  Labs (all labs ordered are listed, but only abnormal results are displayed) Labs Reviewed - No data to display  EKG   Radiology No results found.  Procedures Procedures (including critical care time)  Medications Ordered in UC Medications  ketorolac (TORADOL) 30 MG/ML injection 30 mg (30 mg Intramuscular Given 12/08/22 1145)    Initial Impression / Assessment and Plan / UC Course  I have reviewed the triage vital signs and the nursing notes.  Pertinent labs & imaging results that were available during my care of the patient were reviewed by me and considered in my medical decision making (see chart for details).     Visual acuity declined today by patient.  Consistent with herpes simplex keratitis which she has had multiple times before to this eye.  Treat with Valtrex, IM Toradol for pain, Polytrim drops to prevent against secondary bacterial infection.  Close follow-up with  ophthalmology recommended.  Final Clinical Impressions(s) / UC Diagnoses   Final diagnoses:  Herpes keratitis of left eye   Discharge Instructions   None  ED Prescriptions     Medication Sig Dispense Auth. Provider   valACYclovir (VALTREX) 1000 MG tablet Take 1 tablet (1,000 mg total) by mouth 2 (two) times daily. 14 tablet Particia Nearing, [place]   trimethoprim-polymyxin b (POLYTRIM) ophthalmic solution Place 1 drop into the left eye every 6 (six) hours. 10 mL Particia Nearing, [place]      PDMP not reviewed this encounter.   Particia Nearing, [place] 12/08/22 1221

## 2022-12-09 DIAGNOSIS — B0050 Herpesviral ocular disease, unspecified: Secondary | ICD-10-CM | POA: Diagnosis not present

## 2022-12-14 ENCOUNTER — Other Ambulatory Visit: Payer: Self-pay | Admitting: *Deleted

## 2022-12-14 ENCOUNTER — Encounter: Payer: Self-pay | Admitting: *Deleted

## 2022-12-14 ENCOUNTER — Telehealth: Payer: Self-pay | Admitting: *Deleted

## 2022-12-14 DIAGNOSIS — U071 COVID-19: Secondary | ICD-10-CM | POA: Diagnosis not present

## 2022-12-14 MED ORDER — PEG 3350-KCL-NA BICARB-NACL 420 G PO SOLR: 4000.0000 mL | Freq: Once | ORAL | 0 refills | Status: AC

## 2022-12-14 NOTE — Telephone Encounter (Signed)
Pt called and left vm stating he tested positive for Covid and needed to reschedule his procedure for tomorrow.  Pt has been rescheduled for 01/05/23. Updated instructions mailed to pt.

## 2022-12-14 NOTE — Progress Notes (Signed)
Pt called preop to notify us that he has tested positive for COVID this morning and will have to be rescheduled for his colonoscopy after he recovers. Note sent to [facility] in the office.

## 2022-12-19 DIAGNOSIS — F17200 Nicotine dependence, unspecified, uncomplicated: Secondary | ICD-10-CM | POA: Diagnosis not present

## 2022-12-19 DIAGNOSIS — I10 Essential (primary) hypertension: Secondary | ICD-10-CM | POA: Diagnosis not present

## 2022-12-19 DIAGNOSIS — J439 Emphysema, unspecified: Secondary | ICD-10-CM | POA: Diagnosis not present

## 2022-12-23 DIAGNOSIS — F17210 Nicotine dependence, cigarettes, uncomplicated: Secondary | ICD-10-CM | POA: Diagnosis not present

## 2022-12-23 DIAGNOSIS — I10 Essential (primary) hypertension: Secondary | ICD-10-CM | POA: Diagnosis not present

## 2022-12-23 DIAGNOSIS — Z8249 Family history of ischemic heart disease and other diseases of the circulatory system: Secondary | ICD-10-CM | POA: Diagnosis not present

## 2022-12-23 DIAGNOSIS — Z008 Encounter for other general examination: Secondary | ICD-10-CM | POA: Diagnosis not present

## 2022-12-23 DIAGNOSIS — M1990 Unspecified osteoarthritis, unspecified site: Secondary | ICD-10-CM | POA: Diagnosis not present

## 2022-12-23 DIAGNOSIS — Z809 Family history of malignant neoplasm, unspecified: Secondary | ICD-10-CM | POA: Diagnosis not present

## 2022-12-23 DIAGNOSIS — J439 Emphysema, unspecified: Secondary | ICD-10-CM | POA: Diagnosis not present

## 2023-01-05 ENCOUNTER — Ambulatory Visit (HOSPITAL_COMMUNITY)
Admission: RE | Admit: 2023-01-05 | Discharge: 2023-01-05 | Disposition: A | Discharge status: Home / Self Care | Payer: Medicare HMO | Attending: Internal Medicine | Admitting: Internal Medicine

## 2023-01-05 ENCOUNTER — Other Ambulatory Visit: Payer: Self-pay

## 2023-01-05 ENCOUNTER — Ambulatory Visit (HOSPITAL_COMMUNITY): Payer: Medicare HMO | Admitting: Certified Registered"

## 2023-01-05 ENCOUNTER — Encounter (HOSPITAL_COMMUNITY)
Admission: RE | Disposition: A | Discharge status: Home / Self Care | Payer: Self-pay | Source: Home / Self Care | Attending: Internal Medicine

## 2023-01-05 ENCOUNTER — Encounter (HOSPITAL_COMMUNITY): Payer: Self-pay

## 2023-01-05 DIAGNOSIS — Z8616 Personal history of COVID-19: Secondary | ICD-10-CM | POA: Insufficient documentation

## 2023-01-05 DIAGNOSIS — J449 Chronic obstructive pulmonary disease, unspecified: Secondary | ICD-10-CM | POA: Diagnosis not present

## 2023-01-05 DIAGNOSIS — K648 Other hemorrhoids: Secondary | ICD-10-CM | POA: Diagnosis not present

## 2023-01-05 DIAGNOSIS — D125 Benign neoplasm of sigmoid colon: Secondary | ICD-10-CM | POA: Diagnosis not present

## 2023-01-05 DIAGNOSIS — Z1211 Encounter for screening for malignant neoplasm of colon: Secondary | ICD-10-CM

## 2023-01-05 DIAGNOSIS — F17210 Nicotine dependence, cigarettes, uncomplicated: Secondary | ICD-10-CM | POA: Insufficient documentation

## 2023-01-05 DIAGNOSIS — I10 Essential (primary) hypertension: Secondary | ICD-10-CM | POA: Diagnosis not present

## 2023-01-05 DIAGNOSIS — D126 Benign neoplasm of colon, unspecified: Secondary | ICD-10-CM | POA: Diagnosis not present

## 2023-01-05 DIAGNOSIS — K635 Polyp of colon: Secondary | ICD-10-CM | POA: Diagnosis not present

## 2023-01-05 DIAGNOSIS — D120 Benign neoplasm of cecum: Secondary | ICD-10-CM

## 2023-01-05 HISTORY — PX: POLYPECTOMY: SHX5525

## 2023-01-05 HISTORY — PX: SUBMUCOSAL TATTOO INJECTION: SHX6856

## 2023-01-05 HISTORY — PX: COLONOSCOPY WITH PROPOFOL: SHX5780

## 2023-01-05 SURGERY — COLONOSCOPY WITH PROPOFOL
Anesthesia: General

## 2023-01-05 MED ORDER — PROPOFOL 10 MG/ML IV BOLUS
INTRAVENOUS | Status: DC | PRN
  Administered 2023-01-05: 50 mg via INTRAVENOUS
  Administered 2023-01-05: 100 mg via INTRAVENOUS
  Administered 2023-01-05: 40 mg via INTRAVENOUS

## 2023-01-05 MED ORDER — PHENYLEPHRINE 80 MCG/ML (10ML) SYRINGE FOR IV PUSH (FOR BLOOD PRESSURE SUPPORT)
PREFILLED_SYRINGE | INTRAVENOUS | Status: DC | PRN
  Administered 2023-01-05 (×2): 160 ug via INTRAVENOUS

## 2023-01-05 MED ORDER — DEXMEDETOMIDINE HCL IN NACL 80 MCG/20ML IV SOLN
INTRAVENOUS | Status: DC | PRN
Start: 2023-01-05 — End: 2023-01-05
  Administered 2023-01-05: 4 ug via INTRAVENOUS

## 2023-01-05 MED ORDER — SPOT INK MARKER SYRINGE KIT
PACK | SUBMUCOSAL | Status: AC
  Filled 2023-01-05: qty 5

## 2023-01-05 MED ORDER — PHENYLEPHRINE 80 MCG/ML (10ML) SYRINGE FOR IV PUSH (FOR BLOOD PRESSURE SUPPORT)
PREFILLED_SYRINGE | INTRAVENOUS | Status: AC
  Filled 2023-01-05: qty 10

## 2023-01-05 MED ORDER — SPOT INK MARKER SYRINGE KIT
PACK | SUBMUCOSAL | Status: DC | PRN
Start: 2023-01-05 — End: 2023-01-05
  Administered 2023-01-05: 3 mL via SUBMUCOSAL

## 2023-01-05 MED ORDER — EPHEDRINE 5 MG/ML INJ
INTRAVENOUS | Status: AC
  Filled 2023-01-05: qty 5

## 2023-01-05 MED ORDER — LIDOCAINE HCL (PF) 2 % IJ SOLN
INTRAMUSCULAR | Status: AC
  Filled 2023-01-05: qty 5

## 2023-01-05 MED ORDER — LIDOCAINE HCL (CARDIAC) PF 100 MG/5ML IV SOSY
PREFILLED_SYRINGE | INTRAVENOUS | Status: DC | PRN
  Administered 2023-01-05: 80 mg via INTRAVENOUS
  Administered 2023-01-05: 50 mg via INTRAVENOUS

## 2023-01-05 MED ORDER — PROPOFOL 500 MG/50ML IV EMUL
INTRAVENOUS | Status: AC
  Filled 2023-01-05: qty 50

## 2023-01-05 MED ORDER — EPHEDRINE SULFATE-NACL 50-0.9 MG/10ML-% IV SOSY
PREFILLED_SYRINGE | INTRAVENOUS | Status: DC | PRN
  Administered 2023-01-05: 10 mg via INTRAVENOUS
  Administered 2023-01-05 (×2): 5 mg via INTRAVENOUS

## 2023-01-05 MED ORDER — GLYCOPYRROLATE PF 0.2 MG/ML IJ SOSY
PREFILLED_SYRINGE | INTRAMUSCULAR | Status: DC | PRN
Start: 2023-01-05 — End: 2023-01-05
  Administered 2023-01-05: .1 mg via INTRAVENOUS

## 2023-01-05 MED ORDER — SODIUM CHLORIDE FLUSH 0.9 % IV SOLN
INTRAVENOUS | Status: AC
  Filled 2023-01-05: qty 10

## 2023-01-05 MED ORDER — LACTATED RINGERS IV SOLN: INTRAVENOUS | Status: DC | PRN

## 2023-01-05 MED ORDER — LACTATED RINGERS IV SOLN: INTRAVENOUS | Status: DC

## 2023-01-05 MED ORDER — PROPOFOL 500 MG/50ML IV EMUL
INTRAVENOUS | Status: DC | PRN
  Administered 2023-01-05: 150 ug/kg/min via INTRAVENOUS

## 2023-01-05 NOTE — Op Note (Signed)
[facility] Patient Name: Joel Salazar Procedure Date: 01/05/2023 11:07 AM MRN: [id number] Date of Birth: [birth year] Attending MD: Hennie Duos. Marletta Lor , [place], [id number] CSN: [id number] Age: 52 Admit Type: Outpatient Procedure:                Colonoscopy Indications:              Screening for colorectal malignant neoplasm Providers:                Hennie Duos. Marletta Lor, DO, Tammy Vaught, RN, Elinor Parkinson Referring MD:              Medicines:                See the Anesthesia note for documentation of the                            administered medications Complications:            No immediate complications. Estimated Blood Loss:     Estimated blood loss was minimal. Procedure:                Pre-Anesthesia Assessment:                           - The anesthesia plan was to use monitored                            anesthesia care (MAC).                           After obtaining informed consent, the colonoscope                            was passed under direct vision. Throughout the                            procedure, the patient's blood pressure, pulse, and                            oxygen saturations were monitored continuously. The                            PCF-HQ190L ([id number]) scope was introduced through                            the anus and advanced to the the cecum, identified                            by appendiceal orifice and ileocecal valve. The                            colonoscopy was technically difficult and complex.                            The patient tolerated the procedure well.  The                            quality of the bowel preparation was evaluated                            using the BBPS [facility] Bowel Preparation Scale)                            with scores of: Right Colon = 3, Transverse Colon =                            3 and Left Colon = 3 (entire mucosa seen well with                            no residual  staining, small fragments of stool or                            opaque liquid). The total BBPS score equals 9. Scope In: 11:18:39 AM Scope Out: 12:20:07 PM Scope Withdrawal Time: 0 hours 57 minutes 49 seconds  Total Procedure Duration: 1 hour 1 minute 28 seconds  Findings:      Non-bleeding internal hemorrhoids were found during endoscopy.      Two sessile polyps were found in the cecum. The polyps were 4 to 8 mm in       size. These polyps were removed with a cold snare. Resection and       retrieval were complete.      A 15 mm polyp was found in the sigmoid colon. The polyp was       pedunculated. The polyp was removed with a hot snare. Resection and       retrieval were complete.      A 20-25 mm polyp was found in the sigmoid colon. The polyp was       pedunculated. Unable to perform polypectomy due to location/anatomy       (angulated colon) despite numerous attempts, scope manipulation,       different snare types. Area distal was tattooed with an injection of       Spot (carbon black).      An 8 mm polyp was found in the sigmoid colon. The polyp was       pedunculated. The polyp was removed with a hot snare. Resection and       retrieval were complete. Impression:               - Non-bleeding internal hemorrhoids.                           - Two 4 to 8 mm polyps in the cecum, removed with a                            cold snare. Resected and retrieved.                           - One 15 mm polyp in the sigmoid colon, removed  with a hot snare. Resected and retrieved.                           - One 20-25 mm polyp in the sigmoid colon. Tattooed.                           - One 8 mm polyp in the sigmoid colon, removed with                            a hot snare. Resected and retrieved. Moderate Sedation:      Per Anesthesia Care Recommendation:           - Patient has a contact number available for                            emergencies. The signs and  symptoms of potential                            delayed complications were discussed with the                            patient. Return to normal activities tomorrow.                            Written discharge instructions were provided to the                            patient.                           - Resume previous diet.                           - Continue present medications.                           - Await pathology results.                           - Discuss further with patient after pathology                            reviewed. Consider referral to tertiary care center                            for repeat colonoscopy vs surgical resection. Procedure Code(s):        --- Professional ---                           [phone], Colonoscopy, flexible; with removal of                            tumor(s), polyp(s), or other lesion(s) by snare  technique                           [id number], Colonoscopy, flexible; with directed                            submucosal injection(s), any substance Diagnosis Code(s):        --- Professional ---                           Z12.11, Encounter for screening for malignant                            neoplasm of colon                           D12.0, Benign neoplasm of cecum                           D12.5, Benign neoplasm of sigmoid colon                           K64.8, Other hemorrhoids CPT copyright 2022 American Medical Association. All rights reserved. The codes documented in this report are preliminary and upon coder review may  be revised to meet current compliance requirements. Hennie Duos. Marletta Lor, DO Hennie Duos. Marletta Lor, DO 01/05/2023 12:27:06 PM This report has been signed electronically. Number of Addenda: 0

## 2023-01-05 NOTE — Anesthesia Preprocedure Evaluation (Signed)
Anesthesia Evaluation  Patient identified by MRN, date of birth, ID band Patient awake    Reviewed: Allergy & Precautions, H&P , NPO status , Patient's Chart, lab work & pertinent test results, reviewed documented beta blocker date and time   Airway Mallampati: II  TM Distance: >3 FB Neck ROM: full    Dental no notable dental hx.    Pulmonary neg pulmonary ROS, shortness of breath, COPD, Current Smoker   Pulmonary exam normal breath sounds clear to auscultation       Cardiovascular Exercise Tolerance: Good hypertension, negative cardio ROS  Rhythm:regular Rate:Normal     Neuro/Psych negative neurological ROS  negative psych ROS   GI/Hepatic negative GI ROS, Neg liver ROS,,,  Endo/Other  negative endocrine ROS    Renal/GU negative Renal ROS  negative genitourinary   Musculoskeletal   Abdominal   Peds  Hematology negative hematology ROS (+)   Anesthesia Other Findings   Reproductive/Obstetrics negative OB ROS                             Anesthesia Physical Anesthesia Plan  ASA: 2  Anesthesia Plan: General   Post-op Pain Management:    Induction:   PONV Risk Score and Plan:   Airway Management Planned:   Additional Equipment:   Intra-op Plan:   Post-operative Plan:   Informed Consent: I have reviewed the patients History and Physical, chart, labs and discussed the procedure including the risks, benefits and alternatives for the proposed anesthesia with the patient or authorized representative who has indicated his/her understanding and acceptance.     Dental Advisory Given  Plan Discussed with: CRNA  Anesthesia Plan Comments:        Anesthesia Quick Evaluation

## 2023-01-05 NOTE — H&P (Signed)
Primary Care Physician:  Assunta Found, MD Primary Gastroenterologist:  Dr. Marletta Lor  Pre-Procedure History & Physical: HPI:  Joel Salazar is a 52 y.o. male is here for first ever colonoscopy for colon cancer screening purposes.  Patient denies any family history of colorectal cancer.  No melena or hematochezia.  No abdominal pain or unintentional weight loss.  No change in bowel habits.  Overall feels well from a GI standpoint.  Past Medical History:  Diagnosis Date   Arthritis    Hypertension    Shortness of breath dyspnea    due to rib fractures    Past Surgical History:  Procedure Laterality Date   ACETABULUM FRACTURE SURGERY Left    APPENDECTOMY     BACK SURGERY     Disectomy   FEMUR SURGERY Right    rod placed   ORIF CLAVICULAR FRACTURE Right 01/14/2016   Procedure: OPEN REDUCTION INTERNAL FIXATION (ORIF) CLAVICULAR FRACTURE;  Surgeon: Jones Broom, MD;  Location: [facility] OR;  Service: Orthopedics;  Laterality: Right;    Prior to Admission medications   Medication Sig Start Date End Date Taking? Authorizing Provider  cloNIDine (CATAPRES) 0.1 MG tablet Take 1 tablet (0.1 mg total) by mouth 2 (two) times daily. 07/11/21  Yes Shah, Pratik D, DO  cyclobenzaprine (FLEXERIL) 10 MG tablet Take 1 tablet (10 mg total) by mouth 2 (two) times daily as needed. Patient taking differently: Take 10 mg by mouth 2 (two) times daily as needed for muscle spasms. 06/22/19  Yes Donnetta Hutching, MD  ESCITALOPRAM OXALATE PO Take 5 mg by mouth daily. Patient not taking: Reported on 12/08/2022    [provider]  trimethoprim-polymyxin b (POLYTRIM) ophthalmic solution Place 1 drop into the left eye every 6 (six) hours. 12/08/22   Particia Nearing, PA-C  valACYclovir (VALTREX) 1000 MG tablet Take 1 tablet (1,000 mg total) by mouth 2 (two) times daily. 12/08/22   Particia Nearing, PA-C    Allergies as of 11/18/2022   (No Known Allergies)    No family history on file.  Social History    Socioeconomic History   Marital status: Single    Spouse name: Not on file   Number of children: Not on file   Years of education: Not on file   Highest education level: Not on file  Occupational History   Not on file  Tobacco Use   Smoking status: Every Day    Current packs/day: 2.00    Average packs/day: 2.0 packs/day for 22.0 years (44.0 ttl pk-yrs)    Types: Cigarettes   Smokeless tobacco: Never   Tobacco comments:    Has not smoked since 07/08/21. ARJ 07/14/21  Vaping Use   Vaping status: Never Used  Substance and Sexual Activity   Alcohol use: Yes    Comment: 4 or 5 beers/night   Drug use: Not Currently    Frequency: 3.0 times per week    Types: Marijuana   Sexual activity: Not on file  Other Topics Concern   Not on file  Social History Narrative   Not on file   Social Determinants of Health   Financial Resource Strain: Not on file  Food Insecurity: Not on file  Transportation Needs: Not on file  Physical Activity: Not on file  Stress: Not on file  Social Connections: Not on file  Intimate Partner Violence: Not on file    Review of Systems: See HPI, otherwise negative ROS  Physical Exam: Vital signs in last 24 hours:  General:   Alert,  Well-developed, well-nourished, pleasant and cooperative in NAD Head:  Normocephalic and atraumatic. Eyes:  Sclera clear, no icterus.   Conjunctiva pink. Ears:  Normal auditory acuity. Nose:  No deformity, discharge,  or lesions. Msk:  Symmetrical without gross deformities. Normal posture. Extremities:  Without clubbing or edema. Neurologic:  Alert and  oriented x4;  grossly normal neurologically. Skin:  Intact without significant lesions or rashes. Psych:  Alert and cooperative. Normal mood and affect.  Impression/Plan: Joel Salazar is here for a colonoscopy to be performed for colon cancer screening purposes.  The risks of the procedure including infection, bleed, or perforation as well as benefits,  limitations, alternatives and imponderables have been reviewed with the patient. Questions have been answered. All parties agreeable.

## 2023-01-05 NOTE — Discharge Instructions (Addendum)
  Colonoscopy Discharge Instructions  Read the instructions outlined below and refer to this sheet in the next few weeks. These discharge instructions provide you with general information on caring for yourself after you leave the hospital. Your doctor may also give you specific instructions. While your treatment has been planned according to the most current medical practices available, unavoidable complications occasionally occur.   ACTIVITY You may resume your regular activity, but move at a slower pace for the next 24 hours.  Take frequent rest periods for the next 24 hours.  Walking will help get rid of the air and reduce the bloated feeling in your belly (abdomen).  No driving for 24 hours (because of the medicine (anesthesia) used during the test).   Do not sign any important legal documents or operate any machinery for 24 hours (because of the anesthesia used during the test).  NUTRITION Drink plenty of fluids.  You may resume your normal diet as instructed by your doctor.  Begin with a light meal and progress to your normal diet. Heavy or fried foods are harder to digest and may make you feel sick to your stomach (nauseated).  Avoid alcoholic beverages for 24 hours or as instructed.  MEDICATIONS You may resume your normal medications unless your doctor tells you otherwise.  WHAT YOU CAN EXPECT TODAY Some feelings of bloating in the abdomen.  Passage of more gas than usual.  Spotting of blood in your stool or on the toilet paper.  IF YOU HAD POLYPS REMOVED DURING THE COLONOSCOPY: No aspirin products for 7 days or as instructed.  No alcohol for 7 days or as instructed.  Eat a soft diet for the next 24 hours.  FINDING OUT THE RESULTS OF YOUR TEST Not all test results are available during your visit. If your test results are not back during the visit, make an appointment with your caregiver to find out the results. Do not assume everything is normal if you have not heard from your  caregiver or the medical facility. It is important for you to follow up on all of your test results.  SEEK IMMEDIATE MEDICAL ATTENTION IF: You have more than a spotting of blood in your stool.  Your belly is swollen (abdominal distention).  You are nauseated or vomiting.  You have a temperature over 101.  You have abdominal pain or discomfort that is severe or gets worse throughout the day.   Your colonoscopy revealed 5 polyp(s).  2 were quite large.  I removed 4 polyps successfully.  The largest polyp on the left side of your colon is located in a very difficult portion of your colon.  I was unable to resect this successfully despite numerous attempts.    Await pathology results, my office will contact you.  Depending on pathology results, we will discuss options including possible referral to tertiary care center for repeat colonoscopy versus surgical resection of the large remaining polyp.   I hope you have a great rest of your week!  Hennie Duos. Marletta Lor, D.O. Gastroenterology and Hepatology [facility]

## 2023-01-05 NOTE — Transfer of Care (Signed)
Immediate Anesthesia Transfer of Care Note  Patient: Joel Salazar  Procedure(s) Performed: COLONOSCOPY WITH PROPOFOL POLYPECTOMY SUBMUCOSAL TATTOO INJECTION  Patient Location: Endoscopy Unit  Anesthesia Type:General  Level of Consciousness: awake and alert   Airway & Oxygen Therapy: Patient Spontanous Breathing  Post-op Assessment: Report given to RN and Post -op Vital signs reviewed and stable  Post vital signs: Reviewed and stable  Last Vitals:  Vitals Value Taken Time  BP 104/74 01/05/23 1227  Temp 35.8 C 01/05/23 1227  Pulse 85 01/05/23 1227  Resp 28 01/05/23 1227  SpO2 98 % 01/05/23 1227    Last Pain:  Vitals:   01/05/23 1227  TempSrc: Axillary  PainSc: 0-No pain      Patients Stated Pain Goal: 5 (01/05/23 1037)  Complications: No notable events documented.

## 2023-01-12 NOTE — Anesthesia Postprocedure Evaluation (Signed)
Anesthesia Post Note  Patient: Joel Salazar  Procedure(s) Performed: COLONOSCOPY WITH PROPOFOL POLYPECTOMY SUBMUCOSAL TATTOO INJECTION  Patient location during evaluation: Phase II Anesthesia Type: General Level of consciousness: awake Pain management: pain level controlled Vital Signs Assessment: post-procedure vital signs reviewed and stable Respiratory status: spontaneous breathing and respiratory function stable Cardiovascular status: blood pressure returned to baseline and stable Postop Assessment: no headache and no apparent nausea or vomiting Anesthetic complications: no Comments: Late entry   No notable events documented.   Last Vitals:  Vitals:   01/05/23 1037 01/05/23 1227  BP: 109/79 104/74  Pulse: 84 85  Resp: (!) 21 (!) 28  Temp: 36.5 C (!) 35.8 C  SpO2: 98% 98%    Last Pain:  Vitals:   01/05/23 1227  TempSrc: Axillary  PainSc: 0-No pain                 Windell Norfolk

## 2023-01-14 ENCOUNTER — Encounter (HOSPITAL_COMMUNITY): Payer: Self-pay | Admitting: Internal Medicine

## 2023-01-21 ENCOUNTER — Telehealth: Payer: Self-pay

## 2023-01-21 NOTE — Telephone Encounter (Signed)
Pt called yesterday wanting results to his colonoscopy. Pt was given his results over the phone and did express understanding to the fact that Dr Marletta Lor and Dr Lovell Sheehan are consulting each other regarding the pt having surgery or not. Pt states he will be waiting on a call.

## 2023-01-28 DIAGNOSIS — H1089 Other conjunctivitis: Secondary | ICD-10-CM | POA: Diagnosis not present

## 2023-02-01 DIAGNOSIS — B0052 Herpesviral keratitis: Secondary | ICD-10-CM | POA: Diagnosis not present

## 2023-02-01 DIAGNOSIS — H2513 Age-related nuclear cataract, bilateral: Secondary | ICD-10-CM | POA: Diagnosis not present

## 2023-02-15 DIAGNOSIS — B0052 Herpesviral keratitis: Secondary | ICD-10-CM | POA: Diagnosis not present

## 2023-03-01 ENCOUNTER — Emergency Department (HOSPITAL_COMMUNITY): Payer: Medicare HMO

## 2023-03-01 ENCOUNTER — Other Ambulatory Visit: Payer: Self-pay

## 2023-03-01 ENCOUNTER — Emergency Department (HOSPITAL_COMMUNITY)
Admission: EM | Admit: 2023-03-01 | Discharge: 2023-03-01 | Disposition: A | Discharge status: Home / Self Care | Payer: Medicare HMO | Attending: Emergency Medicine | Admitting: Emergency Medicine

## 2023-03-01 ENCOUNTER — Encounter (HOSPITAL_COMMUNITY): Payer: Self-pay | Admitting: Emergency Medicine

## 2023-03-01 DIAGNOSIS — R079 Chest pain, unspecified: Secondary | ICD-10-CM

## 2023-03-01 DIAGNOSIS — M25512 Pain in left shoulder: Secondary | ICD-10-CM | POA: Diagnosis not present

## 2023-03-01 DIAGNOSIS — M25511 Pain in right shoulder: Secondary | ICD-10-CM | POA: Insufficient documentation

## 2023-03-01 DIAGNOSIS — J449 Chronic obstructive pulmonary disease, unspecified: Secondary | ICD-10-CM | POA: Insufficient documentation

## 2023-03-01 DIAGNOSIS — B0052 Herpesviral keratitis: Secondary | ICD-10-CM | POA: Diagnosis not present

## 2023-03-01 DIAGNOSIS — M546 Pain in thoracic spine: Secondary | ICD-10-CM | POA: Diagnosis not present

## 2023-03-01 DIAGNOSIS — I10 Essential (primary) hypertension: Secondary | ICD-10-CM | POA: Insufficient documentation

## 2023-03-01 DIAGNOSIS — F17200 Nicotine dependence, unspecified, uncomplicated: Secondary | ICD-10-CM | POA: Diagnosis not present

## 2023-03-01 DIAGNOSIS — M62830 Muscle spasm of back: Secondary | ICD-10-CM | POA: Diagnosis not present

## 2023-03-01 DIAGNOSIS — R0789 Other chest pain: Secondary | ICD-10-CM | POA: Diagnosis not present

## 2023-03-01 LAB — COMPREHENSIVE METABOLIC PANEL
ALT: 18 U/L (ref 0–44)
AST: 18 U/L (ref 15–41)
Albumin: 4.1 g/dL (ref 3.5–5.0)
Alkaline Phosphatase: 55 U/L (ref 38–126)
Anion gap: 10 (ref 5–15)
BUN: 9 mg/dL (ref 6–20)
CO2: 26 mmol/L (ref 22–32)
Calcium: 9.1 mg/dL (ref 8.9–10.3)
Chloride: 99 mmol/L (ref 98–111)
Creatinine, Ser: 0.74 mg/dL (ref 0.61–1.24)
GFR, Estimated: >60 mL/min (ref >60)
Glucose, Bld: 92 mg/dL (ref 70–99)
Potassium: 4.4 mmol/L (ref 3.5–5.1)
Sodium: 135 mmol/L (ref 135–145)
Total Bilirubin: 0.7 mg/dL (ref <1.2)
Total Protein: 7.2 g/dL (ref 6.5–8.1)

## 2023-03-01 LAB — CBC WITH DIFFERENTIAL/PLATELET
Abs Immature Granulocytes: 0.01 10*3/uL (ref 0.00–0.07)
Basophils Absolute: 0.1 10*3/uL (ref 0.0–0.1)
Basophils Relative: 1 %
Eosinophils Absolute: 0.1 10*3/uL (ref 0.0–0.5)
Eosinophils Relative: 1 %
HCT: 43.4 % (ref 39.0–52.0)
Hemoglobin: 14.7 g/dL (ref 13.0–17.0)
Immature Granulocytes: 0 %
Lymphocytes Relative: 23 %
Lymphs Abs: 1.5 10*3/uL (ref 0.7–4.0)
MCH: 33.9 pg (ref 26.0–34.0)
MCHC: 33.9 g/dL (ref 30.0–36.0)
MCV: 100.2 fL — ABNORMAL HIGH (ref 80.0–100.0)
Monocytes Absolute: 0.4 10*3/uL (ref 0.1–1.0)
Monocytes Relative: 6 %
Neutro Abs: 4.5 10*3/uL (ref 1.7–7.7)
Neutrophils Relative %: 69 %
Platelets: 228 10*3/uL (ref 150–400)
RBC: 4.33 MIL/uL (ref 4.22–5.81)
RDW: 13.5 % (ref 11.5–15.5)
WBC: 6.6 10*3/uL (ref 4.0–10.5)
nRBC: 0 % (ref 0.0–0.2)

## 2023-03-01 LAB — D-DIMER, QUANTITATIVE: D-Dimer, Quant: <0.27 ug{FEU}/mL (ref 0.00–0.50)

## 2023-03-01 LAB — TROPONIN I (HIGH SENSITIVITY): Troponin I (High Sensitivity): 2 ng/L (ref <18)

## 2023-03-01 MED ORDER — KETOROLAC TROMETHAMINE 15 MG/ML IJ SOLN
15.0000 mg | Freq: Once | INTRAMUSCULAR | Status: AC
  Administered 2023-03-01: 15 mg via INTRAMUSCULAR
  Filled 2023-03-01: qty 1

## 2023-03-01 MED ORDER — DIAZEPAM 5 MG PO TABS
5.0000 mg | ORAL_TABLET | Freq: Once | ORAL | Status: AC
  Administered 2023-03-01: 5 mg via ORAL
  Filled 2023-03-01: qty 1

## 2023-03-01 MED ORDER — ACETAMINOPHEN 500 MG PO TABS
1000.0000 mg | ORAL_TABLET | Freq: Once | ORAL | Status: AC
  Administered 2023-03-01: 1000 mg via ORAL
  Filled 2023-03-01: qty 2

## 2023-03-01 MED ORDER — TIZANIDINE HCL 4 MG PO TABS: 4.0000 mg | ORAL_TABLET | Freq: Three times a day (TID) | ORAL | 0 refills | Status: AC | PRN

## 2023-03-01 MED ORDER — LIDOCAINE 5 % EX PTCH
1.0000 | MEDICATED_PATCH | CUTANEOUS | Status: DC
  Administered 2023-03-01: 1 via TRANSDERMAL
  Filled 2023-03-01: qty 1

## 2023-03-01 NOTE — ED Provider Notes (Incomplete)
[place] EMERGENCY DEPARTMENT AT [facility] Provider Note   CSN: [id number] Arrival date & time: 03/01/23  1001     History Chief Complaint  Patient presents with   Shoulder Pain    Joel Salazar is a 52 y.o. male with h/o COPD, 2 pack per day smoker presents to the ER    Shoulder Pain      Home Medications Prior to Admission medications   Medication Sig Start Date End Date Taking? Authorizing Provider  cloNIDine (CATAPRES) 0.1 MG tablet Take 1 tablet (0.1 mg total) by mouth 2 (two) times daily. 07/11/21   Sherryll Burger, Pratik D, DO  cyclobenzaprine (FLEXERIL) 10 MG tablet Take 1 tablet (10 mg total) by mouth 2 (two) times daily as needed. Patient taking differently: Take 10 mg by mouth 2 (two) times daily as needed for muscle spasms. 06/22/19   Donnetta Hutching, MD  ESCITALOPRAM OXALATE PO Take 5 mg by mouth daily. Patient not taking: Reported on 12/08/2022    [provider]  trimethoprim-polymyxin b (POLYTRIM) ophthalmic solution Place 1 drop into the left eye every 6 (six) hours. 12/08/22   Particia Nearing, PA-C  valACYclovir (VALTREX) 1000 MG tablet Take 1 tablet (1,000 mg total) by mouth 2 (two) times daily. 12/08/22   Particia Nearing, PA-C      Allergies    Patient has no known allergies.    Review of Systems   Review of Systems  Physical Exam Updated Vital Signs BP 112/76 (BP Location: Left Arm)   Pulse 88   Temp 97.8 F (36.6 C) (Oral)   Resp 19   Ht 5\' 11"  (1.803 m)   Wt 68 kg   SpO2 98%   BMI 20.92 kg/m  Physical Exam  ED Results / Procedures / Treatments   Labs (all labs ordered are listed, but only abnormal results are displayed) Labs Reviewed  CBC WITH DIFFERENTIAL/PLATELET - Abnormal; Notable for the following components:      Result Value   MCV 100.2 (*)    All other components within normal limits  COMPREHENSIVE METABOLIC PANEL  D-DIMER, QUANTITATIVE  TROPONIN I (HIGH SENSITIVITY)    EKG EKG  Interpretation Date/Time:  Monday March 01 2023 10:31:06 EST Ventricular Rate:  74 PR Interval:  128 QRS Duration:  83 QT Interval:  376 QTC Calculation: 418 R Axis:   76  Text Interpretation: Sinus rhythm RSR' in V1 or V2, probably normal variant ST elev, probable normal early repol pattern Confirmed by Vonita Moss [phone]) on 03/01/2023 1:54:26 PM  Radiology DG Chest 2 View  Result Date: 03/01/2023 CLINICAL DATA:  Chest pain. EXAM: CHEST - 2 VIEW COMPARISON:  11/28/2021. FINDINGS: Bilateral lungs appear hyperexpanded and hyperlucent with coarse bronchovascular markings, in keeping with COPD. Bilateral lungs otherwise appear clear. No dense consolidation. Bilateral costophrenic angles are clear. Normal cardio-mediastinal silhouette. No acute osseous abnormalities. Right clavicular ORIF noted. Old healed right lateral sixth rib fracture noted. The soft tissues are within normal limits. IMPRESSION: *No active cardiopulmonary disease.  COPD. Electronically Signed   By: Jules Schick M.D.   On: 03/01/2023 13:30    Procedures Procedures  Medications Ordered in ED Medications  lidocaine (LIDODERM) 5 % 1 patch (1 patch Transdermal Patch Applied 03/01/23 1106)  diazepam (VALIUM) tablet 5 mg (5 mg Oral Given 03/01/23 1325)    ED Course/ Medical Decision Making/ A&P    Medical Decision Making Amount and/or Complexity of Data Reviewed Labs: ordered. Radiology: ordered.  Risk Prescription  drug management.   52 y.o. male presents to the ER for evaluation of chest pain and bilateral shoulder pain. Differential diagnosis includes but is not limited to ACS, pericarditis, myocarditis, aortic dissection, PE, pneumothorax, esophageal spasm or rupture, chronic angina, pneumonia, bronchitis, GERD, reflux/PUD, biliary disease, pancreatitis, costochondritis, anxiety, MSK. Vital signs unremarkable. Physical exam as noted above.   I independently reviewed and interpreted the patient's labs.  CBC without leukocytosis or anemia. CMP within normal limits. Troponin 2, do not need edlta. D-dimer is undetectable.  CXR shows *No active cardiopulmonary disease.  COPD. Per radiologist's interpretation.   EKG reviewed and interpreted by my attending and read as Sinus rhythm RSR' in V1 or V2, probably normal variant ST elev, probable normal early repol pattern .   We discussed the results of the labs/imaging. The plan is follow up with cardiology, take medication as needed/prescribed, smoking cessation. We discussed strict return precautions and red flag symptoms. The patient verbalized their understanding and agrees to the plan. The patient is stable and being discharged home in good condition.  Portions of this report may have been transcribed using voice recognition software. Every effort was made to ensure accuracy; however, inadvertent computerized transcription errors may be present.   I discussed this case with my attending physician who cosigned this note including patient's presenting symptoms, physical exam, and planned diagnostics and interventions. Attending physician stated agreement with plan or made changes to plan which were implemented.   Final Clinical Impression(s) / ED Diagnoses Final diagnoses:  Nonspecific chest pain  Spasm of thoracic back muscle  Essential hypertension    Rx / DC Orders ED Discharge Orders     None

## 2023-03-01 NOTE — ED Triage Notes (Signed)
Pt states he has had R shoulder pain, CP, SHOB x Saturday. Rates pain 10/10. Deneis dizziness at this time.

## 2023-03-01 NOTE — Discharge Instructions (Addendum)
You were seen in the ER for evaluation of your chest pain and shoulder pain. Your lab work was unremarkable. Your x-ray showed COPD. Please make sure you follow up with cardiology.  They should reach out to you in the next few days to schedule appointment however if they do not, please call to schedule an appointment.  For pain, recommend taking 600 mg of ibuprofen and 1000 mg of Tylenol every 6 hours as needed.  I have also prescribed you some tizanidine which is a muscle relaxer.  Please take as needed.  Do not drive or operate any heavy machinery while on this medication as it will make you sleepy.  He can also try over-the-counter lidocaine patches, or getting a massage.  Try to rest as to not worsen the muscle spasms.  Additionally, smoking cessation will likely help with some of the symptoms as well.  Please discuss this with your primary care provider.  If you have any concerns, new or worsening symptoms, please return to your nearest emerged apartment for reevaluation.  Contact a doctor if: Your chest pain does not go away. You feel depressed. You have a fever. Get help right away if: Your chest pain is worse. You have a cough that gets worse, or you cough up blood. You have very bad (severe) pain in your belly (abdomen). You pass out (faint). You have either of these for no clear reason: Sudden chest discomfort. Sudden discomfort in your arms, back, neck, or jaw. You have shortness of breath at any time. You suddenly start to sweat, or your skin gets clammy. You feel sick to your stomach (nauseous). You throw up (vomit). You suddenly feel lightheaded or dizzy. You feel very weak or tired. Your heart starts to beat fast, or it feels like it is skipping beats. These symptoms may be an emergency. Do not wait to see if the symptoms will go away. Get medical help right away. Call your local emergency services (911 in the [place] Do not drive yourself to the hospital.

## 2023-03-04 NOTE — ED Provider Notes (Incomplete)
EMERGENCY DEPARTMENT AT [facility] Provider Note   CSN: [id number] Arrival date & time: 03/01/23  1001     History Chief Complaint  Patient presents with  . Shoulder Pain    Joel Salazar is a 52 y.o. male with h/o COPD, pneumothorax, 2 pack per day smoker presents to the ER today for evaluation of bilateral shoulder pain and chest pain for the past two weeks, but worsening over the past few days. He reports he has chronic shoulder pain and has had surgery on the right shoulder before. Reports that this hurt worse with movement. For his chest pain,    Shoulder Pain      Home Medications Prior to Admission medications   Medication Sig Start Date End Date Taking? Authorizing Provider  cloNIDine (CATAPRES) 0.1 MG tablet Take 1 tablet (0.1 mg total) by mouth 2 (two) times daily. 07/11/21   Sherryll Burger, Pratik D, DO  cyclobenzaprine (FLEXERIL) 10 MG tablet Take 1 tablet (10 mg total) by mouth 2 (two) times daily as needed. Patient taking differently: Take 10 mg by mouth 2 (two) times daily as needed for muscle spasms. 06/22/19   Donnetta Hutching, MD  ESCITALOPRAM OXALATE PO Take 5 mg by mouth daily. Patient not taking: Reported on 12/08/2022    [provider]  trimethoprim-polymyxin b (POLYTRIM) ophthalmic solution Place 1 drop into the left eye every 6 (six) hours. 12/08/22   Particia Nearing, PA-C  valACYclovir (VALTREX) 1000 MG tablet Take 1 tablet (1,000 mg total) by mouth 2 (two) times daily. 12/08/22   Particia Nearing, PA-C      Allergies    Patient has no known allergies.    Review of Systems   Review of Systems  Physical Exam Updated Vital Signs BP 112/76 (BP Location: Left Arm)   Pulse 88   Temp 97.8 F (36.6 C) (Oral)   Resp 19   Ht 5\' 11"  (1.803 m)   Wt 68 kg   SpO2 98%   BMI 20.92 kg/m  Physical Exam  ED Results / Procedures / Treatments   Labs (all labs ordered are listed, but only abnormal results are displayed) Labs  Reviewed  CBC WITH DIFFERENTIAL/PLATELET - Abnormal; Notable for the following components:      Result Value   MCV 100.2 (*)    All other components within normal limits  COMPREHENSIVE METABOLIC PANEL  D-DIMER, QUANTITATIVE  TROPONIN I (HIGH SENSITIVITY)    EKG EKG Interpretation Date/Time:  Monday March 01 2023 10:31:06 EST Ventricular Rate:  74 PR Interval:  128 QRS Duration:  83 QT Interval:  376 QTC Calculation: 418 R Axis:   76  Text Interpretation: Sinus rhythm RSR' in V1 or V2, probably normal variant ST elev, probable normal early repol pattern Confirmed by Vonita Moss [phone]) on 03/01/2023 1:54:26 PM  Radiology DG Chest 2 View  Result Date: 03/01/2023 CLINICAL DATA:  Chest pain. EXAM: CHEST - 2 VIEW COMPARISON:  11/28/2021. FINDINGS: Bilateral lungs appear hyperexpanded and hyperlucent with coarse bronchovascular markings, in keeping with COPD. Bilateral lungs otherwise appear clear. No dense consolidation. Bilateral costophrenic angles are clear. Normal cardio-mediastinal silhouette. No acute osseous abnormalities. Right clavicular ORIF noted. Old healed right lateral sixth rib fracture noted. The soft tissues are within normal limits. IMPRESSION: *No active cardiopulmonary disease.  COPD. Electronically Signed   By: Jules Schick M.D.   On: 03/01/2023 13:30    Procedures Procedures  Medications Ordered in ED Medications  lidocaine (LIDODERM) 5 %  1 patch (1 patch Transdermal Patch Applied 03/01/23 1106)  diazepam (VALIUM) tablet 5 mg (5 mg Oral Given 03/01/23 1325)    ED Course/ Medical Decision Making/ A&P    Medical Decision Making Amount and/or Complexity of Data Reviewed Labs: ordered. Radiology: ordered.  Risk OTC drugs. Prescription drug management.   52 y.o. male presents to the ER for evaluation of chest pain and bilateral shoulder pain. Differential diagnosis includes but is not limited to ACS, pericarditis, myocarditis, aortic dissection,  PE, pneumothorax, esophageal spasm or rupture, chronic angina, pneumonia, bronchitis, GERD, reflux/PUD, biliary disease, pancreatitis, costochondritis, anxiety, MSK. Vital signs unremarkable. Physical exam as noted above.   I independently reviewed and interpreted the patient's labs. CBC without leukocytosis or anemia. CMP within normal limits. Troponin 2, do not need edlta. D-dimer is undetectable.  CXR shows *No active cardiopulmonary disease.  COPD. Per radiologist's interpretation.   EKG reviewed and interpreted by my attending and read as Sinus rhythm RSR' in V1 or V2, probably normal variant ST elev, probable normal early repol pattern .  Given the reassuring EKG, normal trop, and undetectable d-dimer. Less likely ACS, PE, dissection, or aneurysm. CXR does not show any signs of PNA or PTX. Lungs sounds are clear. The chest pain and back pain are separate and do not come together. Both are worse with movement. 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 Given the palpable spasm of the back, this is likely MSK in nature. Will send ambulatory referral to cardiology for him to follow up with as well. The patient does not appear in any acute       We discussed the results of the labs/imaging. The plan is follow up with cardiology, take medication as needed/prescribed, smoking cessation. We discussed strict return precautions and red flag symptoms. The patient verbalized their understanding and agrees to the plan. The patient is stable and being discharged home in good condition.  Portions of this report may have been transcribed using voice recognition software. Every effort was made to ensure accuracy; however, inadvertent computerized transcription errors may be present.   I discussed this case with my attending physician who cosigned this note including patient's presenting symptoms, physical exam, and planned diagnostics and  interventions. Attending physician stated agreement with plan or made changes to plan which were implemented.   Final Clinical Impression(s) / ED Diagnoses Final diagnoses:  Nonspecific chest pain  Spasm of thoracic back muscle  Essential hypertension    Rx / DC Orders ED Discharge Orders          Ordered    Ambulatory referral to Cardiology        03/01/23 1346    tiZANidine (ZANAFLEX) 4 MG tablet  Every 8 hours PRN        03/01/23 1355

## 2023-03-04 NOTE — ED Provider Notes (Signed)
[place] EMERGENCY DEPARTMENT AT [facility] Provider Note   CSN: [id number] Arrival date & time: 03/01/23  1001     History Chief Complaint  Patient presents with   Shoulder Pain    Joel Salazar is a 52 y.o. male with h/o COPD, pneumothorax, 2 pack per day smoker presents to the ER today for evaluation of bilateral shoulder pain and chest pain for the past two weeks, but worsening over the past few days. He reports he has chronic shoulder pain and has had surgery on the right shoulder before. Reports that this hurt worse with movement. For his chest pain, he reports that has been going on for two weeks but has been off and on for years. Denies any lightheadedness, nausea, or syncope. Denies any diaphoresis or hemoptysis. Has not followed up with this. 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 He reports pain with lifting his arms. Denies any palpations, fevers, or recent illnesses. He tried an old muscle relaxer without much relief of symptoms. Daily smoking and alcohol use. Reports MJ use. Denies any IVDU.   Shoulder Pain Associated symptoms: back pain   Associated symptoms: no fever and no neck pain        Home Medications Prior to Admission medications   Medication Sig Start Date End Date Taking? Authorizing Provider  tiZANidine (ZANAFLEX) 4 MG tablet Take 1 tablet (4 mg total) by mouth every 8 (eight) hours as needed for muscle spasms. 03/01/23  Yes Achille Rich, PA-C  cloNIDine (CATAPRES) 0.1 MG tablet Take 1 tablet (0.1 mg total) by mouth 2 (two) times daily. 07/11/21   Sherryll Burger, Pratik D, DO  ESCITALOPRAM OXALATE PO Take 5 mg by mouth daily. Patient not taking: Reported on 12/08/2022    [provider]  trimethoprim-polymyxin b (POLYTRIM) ophthalmic solution Place 1 drop into the left eye every 6 (six) hours. 12/08/22   Particia Nearing, PA-C      Allergies    Patient has no known  allergies.    Review of Systems   Review of Systems  Constitutional:  Negative for chills and fever.  Respiratory:  Positive for cough (chronic) and shortness of breath (chronic).   Cardiovascular:  Positive for chest pain. Negative for palpitations and leg swelling.  Gastrointestinal:  Negative for abdominal pain, constipation, diarrhea, nausea and vomiting.  Musculoskeletal:  Positive for back pain and myalgias. Negative for neck pain and neck stiffness.  Neurological:  Negative for light-headedness, numbness and headaches.    Physical Exam Updated Vital Signs BP 112/76 (BP Location: Left Arm)   Pulse 88   Temp 97.8 F (36.6 C) (Oral)   Resp 19   Ht 5\' 11"  (1.803 m)   Wt 68 kg   SpO2 98%   BMI 20.92 kg/m  Physical Exam Vitals and nursing note reviewed.  Constitutional:      General: He is not in acute distress.    Appearance: He is not ill-appearing or toxic-appearing.     Comments: Sitting on stretcher in no acute distress  Eyes:     General: No scleral icterus. Cardiovascular:     Rate and Rhythm: Normal rate.     Pulses:          Radial pulses are 2+ on the right side and 2+ on the left side.       Dorsalis pedis pulses are 2+ on the right side and 2+ on the  left side.       Posterior tibial pulses are 2+ on the right side and 2+ on the left side.  Pulmonary:     Effort: Pulmonary effort is normal. No respiratory distress.     Breath sounds: Normal breath sounds. No rhonchi.  Abdominal:     Palpations: Abdomen is soft.     Tenderness: There is no abdominal tenderness. There is no guarding or rebound.  Musculoskeletal:        General: Tenderness present.     Cervical back: Normal range of motion. No rigidity.       Back:     Right lower leg: No edema.     Left lower leg: No edema.  Skin:    General: Skin is warm and dry.  Neurological:     Mental Status: He is alert.     ED Results / Procedures / Treatments   Labs (all labs ordered are listed, but only  abnormal results are displayed) Labs Reviewed  CBC WITH DIFFERENTIAL/PLATELET - Abnormal; Notable for the following components:      Result Value   MCV 100.2 (*)    All other components within normal limits  COMPREHENSIVE METABOLIC PANEL  D-DIMER, QUANTITATIVE  TROPONIN I (HIGH SENSITIVITY)    EKG EKG Interpretation Date/Time:  Monday March 01 2023 10:31:06 EST Ventricular Rate:  74 PR Interval:  128 QRS Duration:  83 QT Interval:  376 QTC Calculation: 418 R Axis:   76  Text Interpretation: Sinus rhythm RSR' in V1 or V2, probably normal variant ST elev, probable normal early repol pattern Confirmed by Vonita Moss [phone]) on 03/01/2023 1:54:26 PM  Radiology No results found.  Procedures Procedures   Medications Ordered in ED Medications  diazepam (VALIUM) tablet 5 mg (5 mg Oral Given 03/01/23 1325)  ketorolac (TORADOL) 15 MG/ML injection 15 mg (15 mg Intramuscular Given 03/01/23 1403)  acetaminophen (TYLENOL) tablet 1,000 mg (1,000 mg Oral Given 03/01/23 1359)    ED Course/ Medical Decision Making/ A&P                              Medical Decision Making Amount and/or Complexity of Data Reviewed Labs: ordered. Radiology: ordered.  Risk OTC drugs. Prescription drug management.   52 y.o. male presents to the ER for evaluation of chest pain and bilateral shoulder pain. Differential diagnosis includes but is not limited to ACS, pericarditis, myocarditis, aortic dissection, PE, pneumothorax, esophageal spasm or rupture, chronic angina, pneumonia, bronchitis, GERD, reflux/PUD, biliary disease, pancreatitis, costochondritis, anxiety, MSK. Vital signs unremarkable. Physical exam as noted above.  I independently reviewed and interpreted the patient's labs. CBC without leukocytosis or anemia. CMP within normal limits. Troponin 2, do not need delta. D-dimer is undetectable.  CXR shows *No active cardiopulmonary disease.  COPD. Per radiologist's interpretation.  EKG  reviewed and interpreted by my attending and read as Sinus rhythm RSR' in V1 or V2, probably normal variant ST elev, probable normal early repol pattern .  Given the reassuring EKG, normal trop, and undetectable d-dimer. Less likely ACS, PE, dissection, or aneurysm. Palpable pulses throughout that are symmetric. CXR does not show any signs of PNA or PTX. Lungs sounds are clear. The chest pain and back pain are separate and do not come together. Both are worse with movement. 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 Given the palpable spasm of the back,  this is likely MSK in nature. Will send ambulatory referral to cardiology for him to follow up with as well. The patient does not appear in any acute distress. Reports improvement with pain medication. Will send him home with new muscle relaxers and follow up with cardiology. Stable for discharge home with strict return precautions.  We discussed the results of the labs/imaging. The plan is follow up with cardiology, take medication as needed/prescribed, smoking cessation. We discussed strict return precautions and red flag symptoms. The patient verbalized their understanding and agrees to the plan. The patient is stable and being discharged home in good condition.  Portions of this report may have been transcribed using voice recognition software. Every effort was made to ensure accuracy; however, inadvertent computerized transcription errors may be present.   I discussed this case with my attending physician who cosigned this note including patient's presenting symptoms, physical exam, and planned diagnostics and interventions. Attending physician stated agreement with plan or made changes to plan which were implemented.  Final Clinical Impression(s) / ED Diagnoses Final diagnoses:  Nonspecific chest pain  Spasm of thoracic back muscle  Essential hypertension    Rx / DC Orders ED  Discharge Orders          Ordered    Ambulatory referral to Cardiology        03/01/23 1346    tiZANidine (ZANAFLEX) 4 MG tablet  Every 8 hours PRN        03/01/23 1355              Achille Rich, PA-C 03/05/23 2338    Rondel Baton, MD 03/06/23 1157

## 2023-03-11 ENCOUNTER — Telehealth: Payer: Self-pay | Admitting: *Deleted

## 2023-03-11 NOTE — Telephone Encounter (Signed)
Per Dr. Marletta Lor "flex sig ASA 2, sigmoid polyp"  Called pt and scheduled for 11/26. He is aware of arrival time of 6:45am. Also discussed instructions over the phone with him for flex sig. He voiced understanding.

## 2023-03-15 DIAGNOSIS — H2513 Age-related nuclear cataract, bilateral: Secondary | ICD-10-CM | POA: Diagnosis not present

## 2023-03-15 DIAGNOSIS — B0052 Herpesviral keratitis: Secondary | ICD-10-CM | POA: Diagnosis not present

## 2023-03-15 NOTE — Anesthesia Preprocedure Evaluation (Signed)
Anesthesia Evaluation  Patient identified by MRN, date of birth, ID band Patient awake    Reviewed: Allergy & Precautions, H&P , NPO status , Patient's Chart, lab work & pertinent test results, reviewed documented beta blocker date and time   Airway Mallampati: II  TM Distance: >3 FB Neck ROM: full    Dental no notable dental hx. (+) Dental Advisory Given, Teeth Intact   Pulmonary shortness of breath, COPD, Current Smoker   Pulmonary exam normal breath sounds clear to auscultation       Cardiovascular Exercise Tolerance: Good hypertension, Normal cardiovascular exam Rhythm:regular Rate:Normal     Neuro/Psych negative neurological ROS  negative psych ROS   GI/Hepatic negative GI ROS, Neg liver ROS,,,  Endo/Other  negative endocrine ROS    Renal/GU negative Renal ROS  negative genitourinary   Musculoskeletal   Abdominal   Peds  Hematology negative hematology ROS (+)   Anesthesia Other Findings   Reproductive/Obstetrics negative OB ROS                              Anesthesia Physical Anesthesia Plan  ASA: 2  Anesthesia Plan: General   Post-op Pain Management: Minimal or no pain anticipated   Induction: Intravenous  PONV Risk Score and Plan: Propofol infusion  Airway Management Planned: Nasal Cannula and Natural Airway  Additional Equipment: None  Intra-op Plan:   Post-operative Plan:   Informed Consent: I have reviewed the patients History and Physical, chart, labs and discussed the procedure including the risks, benefits and alternatives for the proposed anesthesia with the patient or authorized representative who has indicated his/her understanding and acceptance.     Dental Advisory Given  Plan Discussed with: CRNA  Anesthesia Plan Comments:         Anesthesia Quick Evaluation

## 2023-03-16 ENCOUNTER — Encounter (HOSPITAL_COMMUNITY): Payer: Self-pay

## 2023-03-16 ENCOUNTER — Ambulatory Visit (HOSPITAL_COMMUNITY): Payer: Self-pay | Admitting: Anesthesiology

## 2023-03-16 ENCOUNTER — Encounter (HOSPITAL_COMMUNITY)
Admission: RE | Disposition: A | Discharge status: Home / Self Care | Payer: Self-pay | Source: Home / Self Care | Attending: Internal Medicine

## 2023-03-16 ENCOUNTER — Ambulatory Visit (HOSPITAL_BASED_OUTPATIENT_CLINIC_OR_DEPARTMENT_OTHER): Payer: Medicare HMO | Admitting: Anesthesiology

## 2023-03-16 ENCOUNTER — Other Ambulatory Visit: Payer: Self-pay

## 2023-03-16 ENCOUNTER — Ambulatory Visit (HOSPITAL_COMMUNITY)
Admission: RE | Admit: 2023-03-16 | Discharge: 2023-03-16 | Disposition: A | Discharge status: Home / Self Care | Payer: Medicare HMO | Attending: Internal Medicine | Admitting: Internal Medicine

## 2023-03-16 DIAGNOSIS — K648 Other hemorrhoids: Secondary | ICD-10-CM | POA: Insufficient documentation

## 2023-03-16 DIAGNOSIS — K5140 Inflammatory polyps of colon without complications: Secondary | ICD-10-CM | POA: Insufficient documentation

## 2023-03-16 DIAGNOSIS — Z1211 Encounter for screening for malignant neoplasm of colon: Secondary | ICD-10-CM

## 2023-03-16 DIAGNOSIS — Z860100 Personal history of colon polyps, unspecified: Secondary | ICD-10-CM

## 2023-03-16 DIAGNOSIS — F17210 Nicotine dependence, cigarettes, uncomplicated: Secondary | ICD-10-CM | POA: Diagnosis not present

## 2023-03-16 DIAGNOSIS — I10 Essential (primary) hypertension: Secondary | ICD-10-CM | POA: Diagnosis not present

## 2023-03-16 DIAGNOSIS — Z09 Encounter for follow-up examination after completed treatment for conditions other than malignant neoplasm: Secondary | ICD-10-CM | POA: Insufficient documentation

## 2023-03-16 DIAGNOSIS — J449 Chronic obstructive pulmonary disease, unspecified: Secondary | ICD-10-CM | POA: Diagnosis not present

## 2023-03-16 HISTORY — PX: FLEXIBLE SIGMOIDOSCOPY: SHX5431

## 2023-03-16 HISTORY — PX: POLYPECTOMY: SHX149

## 2023-03-16 SURGERY — SIGMOIDOSCOPY, FLEXIBLE
Anesthesia: General

## 2023-03-16 MED ORDER — PROPOFOL 10 MG/ML IV BOLUS
INTRAVENOUS | Status: AC
  Filled 2023-03-16: qty 20

## 2023-03-16 MED ORDER — PROPOFOL 10 MG/ML IV BOLUS
INTRAVENOUS | Status: DC | PRN
  Administered 2023-03-16 (×2): 50 mg via INTRAVENOUS
  Administered 2023-03-16 (×2): 100 mg via INTRAVENOUS
  Administered 2023-03-16: 50 mg via INTRAVENOUS
  Administered 2023-03-16: 100 mg via INTRAVENOUS

## 2023-03-16 MED ORDER — LIDOCAINE HCL (PF) 2 % IJ SOLN
INTRAMUSCULAR | Status: DC | PRN
  Administered 2023-03-16: 100 mg via INTRADERMAL

## 2023-03-16 MED ORDER — LACTATED RINGERS IV SOLN: INTRAVENOUS | Status: DC | PRN

## 2023-03-16 MED ORDER — LIDOCAINE HCL (PF) 2 % IJ SOLN
INTRAMUSCULAR | Status: AC
  Filled 2023-03-16: qty 5

## 2023-03-16 NOTE — Anesthesia Postprocedure Evaluation (Signed)
Anesthesia Post Note  Patient: Joel Salazar  Procedure(s) Performed: FLEXIBLE SIGMOIDOSCOPY POLYPECTOMY INTESTINAL  Patient location during evaluation: PACU Anesthesia Type: General Level of consciousness: awake and alert Pain management: pain level controlled Vital Signs Assessment: post-procedure vital signs reviewed and stable Respiratory status: spontaneous breathing, nonlabored ventilation, respiratory function stable and patient connected to nasal cannula oxygen Cardiovascular status: blood pressure returned to baseline and stable Postop Assessment: no apparent nausea or vomiting Anesthetic complications: no   There were no known notable events for this encounter.   Last Vitals:  Vitals:   03/16/23 0726 03/16/23 0813  BP: 132/79 104/62  Pulse: 78 93  Resp: (!) 28 (!) 24  Temp: 36.4 C 36.4 C  SpO2: 98% 98%    Last Pain:  Vitals:   03/16/23 0813  TempSrc: Oral  PainSc: 0-No pain                 Dhyan Noah L Valeen Borys

## 2023-03-16 NOTE — Transfer of Care (Signed)
Immediate Anesthesia Transfer of Care Note  Patient: Joel Salazar  Procedure(s) Performed: FLEXIBLE SIGMOIDOSCOPY POLYPECTOMY INTESTINAL  Patient Location: Endoscopy Unit  Anesthesia Type:General  Level of Consciousness: awake, alert , and oriented  Airway & Oxygen Therapy: Patient Spontanous Breathing  Post-op Assessment: Report given to RN and Post -op Vital signs reviewed and stable  Post vital signs: Reviewed and stable  Last Vitals:  Vitals Value Taken Time  BP    Temp    Pulse    Resp    SpO2      Last Pain:  Vitals:   03/16/23 0748  TempSrc:   PainSc: 0-No pain      Patients Stated Pain Goal: 10 (03/16/23 0716)  Complications: No notable events documented.

## 2023-03-16 NOTE — Op Note (Signed)
[facility] Patient Name: Joel Salazar Procedure Date: 03/16/2023 7:04 AM MRN: [id number] Date of Birth: 20-Jun-1970 Attending MD: Hennie Duos. Marletta Lor , [place], [id number] CSN: [id number] Age: 52 Admit Type: Outpatient Procedure:                Flexible Sigmoidoscopy Indications:              Personal history of colonic polyps Providers:                Hennie Duos. Marletta Lor, DO, Crystal Page, Lennice Sites                            Technician, Technician Referring MD:              Medicines:                See the Anesthesia note for documentation of the                            administered medications Complications:            No immediate complications. Estimated Blood Loss:     Estimated blood loss was minimal. Procedure:                Pre-Anesthesia Assessment:                           - The anesthesia plan was to use monitored                            anesthesia care (MAC).                           After obtaining informed consent, the scope was                            passed under direct vision. The [id number]                            scope was introduced through the anus and advanced                            to the the descending colon. The flexible                            sigmoidoscopy was accomplished without difficulty.                            The patient tolerated the procedure well. The                            quality of the bowel preparation was good. Scope In: 7:52:06 AM Scope Out: 8:10:19 AM Total Procedure Duration: 0 hours 18 minutes 13 seconds  Findings:      Non-bleeding internal hemorrhoids were found during endoscopy.      A 25 mm polyp was found in the sigmoid colon. The polyp was       pedunculated. The polyp was removed with a hot snare. Resection  and       retrieval were complete.      A tattoo was seen in the sigmoid colon. The tattoo site appeared normal. Impression:               - Non-bleeding internal hemorrhoids.                            - One 25 mm polyp in the sigmoid colon, removed                            with a hot snare. Resected and retrieved.                           - A tattoo was seen in the sigmoid colon. The                            tattoo site appeared normal. Moderate Sedation:      Per Anesthesia Care Recommendation:           - Patient has a contact number available for                            emergencies. The signs and symptoms of potential                            delayed complications were discussed with the                            patient. Return to normal activities tomorrow.                            Written discharge instructions were provided to the                            patient.                           - Resume previous diet.                           - Await pathology results.                           - Return to GI clinic in 3 months. Procedure Code(s):        --- Professional ---                           [phone], Sigmoidoscopy, flexible; with removal of                            tumor(s), polyp(s), or other lesion(s) by snare                            technique Diagnosis Code(s):        --- Professional ---  D12.5, Benign neoplasm of sigmoid colon                           K64.8, Other hemorrhoids                           Z86.010, Personal history of colonic polyps CPT copyright 2022 American Medical Association. All rights reserved. The codes documented in this report are preliminary and upon coder review may  be revised to meet current compliance requirements. Hennie Duos. Marletta Lor, DO Hennie Duos. Marletta Lor, DO 03/16/2023 8:14:43 AM This report has been signed electronically. Number of Addenda: 0

## 2023-03-16 NOTE — Discharge Instructions (Signed)
  Colonoscopy Discharge Instructions  Read the instructions outlined below and refer to this sheet in the next few weeks. These discharge instructions provide you with general information on caring for yourself after you leave the hospital. Your doctor may also give you specific instructions. While your treatment has been planned according to the most current medical practices available, unavoidable complications occasionally occur.   ACTIVITY You may resume your regular activity, but move at a slower pace for the next 24 hours.  Take frequent rest periods for the next 24 hours.  Walking will help get rid of the air and reduce the bloated feeling in your belly (abdomen).  No driving for 24 hours (because of the medicine (anesthesia) used during the test).   Do not sign any important legal documents or operate any machinery for 24 hours (because of the anesthesia used during the test).  NUTRITION Drink plenty of fluids.  You may resume your normal diet as instructed by your doctor.  Begin with a light meal and progress to your normal diet. Heavy or fried foods are harder to digest and may make you feel sick to your stomach (nauseated).  Avoid alcoholic beverages for 24 hours or as instructed.  MEDICATIONS You may resume your normal medications unless your doctor tells you otherwise.  WHAT YOU CAN EXPECT TODAY Some feelings of bloating in the abdomen.  Passage of more gas than usual.  Spotting of blood in your stool or on the toilet paper.  IF YOU HAD POLYPS REMOVED DURING THE COLONOSCOPY: No aspirin products for 7 days or as instructed.  No alcohol for 7 days or as instructed.  Eat a soft diet for the next 24 hours.  FINDING OUT THE RESULTS OF YOUR TEST Not all test results are available during your visit. If your test results are not back during the visit, make an appointment with your caregiver to find out the results. Do not assume everything is normal if you have not heard from your  caregiver or the medical facility. It is important for you to follow up on all of your test results.  SEEK IMMEDIATE MEDICAL ATTENTION IF: You have more than a spotting of blood in your stool.  Your belly is swollen (abdominal distention).  You are nauseated or vomiting.  You have a temperature over 101.  You have abdominal pain or discomfort that is severe or gets worse throughout the day.   I was able to remove the large polyp in the left side your colon with larger therapeutic scope today.  Await pathology results, my office will contact you..  That one of your hemorrhoids was actively oozing blood during procedure.  You will likely have further mild rectal bleeding.  Let us know if you have profuse rectal bleeding.  Follow-up in GI office in 3 to 4 months.  I hope you have a great rest of your week!  Hennie Duos. Marletta Lor, D.O. Gastroenterology and Hepatology [facility]

## 2023-03-16 NOTE — H&P (Signed)
Primary Care Physician:  Assunta Found, MD Primary Gastroenterologist:  Dr. Marletta Lor  Pre-Procedure History & Physical: HPI:  Joel Salazar is a 52 y.o. male is here for a flexible sigmoidoscopy due to history of large sigmoid polyp  Past Medical History:  Diagnosis Date   Arthritis    Hypertension    Shortness of breath dyspnea    due to rib fractures    Past Surgical History:  Procedure Laterality Date   ACETABULUM FRACTURE SURGERY Left    APPENDECTOMY     BACK SURGERY     Disectomy   COLONOSCOPY WITH PROPOFOL N/A 01/05/2023   Procedure: COLONOSCOPY WITH PROPOFOL;  Surgeon: Lanelle Bal, DO;  Location: [facility];  Service: Endoscopy;  Laterality: N/A;  11:00 AM, ASA 2   FEMUR SURGERY Right    rod placed   ORIF CLAVICULAR FRACTURE Right 01/14/2016   Procedure: OPEN REDUCTION INTERNAL FIXATION (ORIF) CLAVICULAR FRACTURE;  Surgeon: Jones Broom, MD;  Location: [facility] OR;  Service: Orthopedics;  Laterality: Right;   POLYPECTOMY  01/05/2023   Procedure: POLYPECTOMY;  Surgeon: Lanelle Bal, DO;  Location: [facility];  Service: Endoscopy;;  hot and cold snare   SUBMUCOSAL TATTOO INJECTION  01/05/2023   Procedure: SUBMUCOSAL TATTOO INJECTION;  Surgeon: Lanelle Bal, DO;  Location: [facility];  Service: Endoscopy;;    Prior to Admission medications   Medication Sig Start Date End Date Taking? Authorizing Provider  cloNIDine (CATAPRES) 0.1 MG tablet Take 1 tablet (0.1 mg total) by mouth 2 (two) times daily. 07/11/21  Yes Shah, Pratik D, DO  ESCITALOPRAM OXALATE PO Take 5 mg by mouth daily.   Yes [provider]  tiZANidine (ZANAFLEX) 4 MG tablet Take 1 tablet (4 mg total) by mouth every 8 (eight) hours as needed for muscle spasms. 03/01/23  Yes Achille Rich, PA-C  trimethoprim-polymyxin b (POLYTRIM) ophthalmic solution Place 1 drop into the left eye every 6 (six) hours. 12/08/22  Yes Particia Nearing, PA-C    Allergies as of 03/11/2023   (No Known  Allergies)    History reviewed. No pertinent family history.  Social History   Socioeconomic History   Marital status: Divorced    Spouse name: Not on file   Number of children: Not on file   Years of education: Not on file   Highest education level: Not on file  Occupational History   Not on file  Tobacco Use   Smoking status: Every Day    Current packs/day: 2.00    Average packs/day: 2.0 packs/day for 22.0 years (44.0 ttl pk-yrs)    Types: Cigarettes   Smokeless tobacco: Never   Tobacco comments:    Has not smoked since 07/08/21. ARJ 07/14/21  Vaping Use   Vaping status: Never Used  Substance and Sexual Activity   Alcohol use: Yes    Comment: 6 beers/night   Drug use: Not Currently    Frequency: 3.0 times per week    Types: Marijuana   Sexual activity: Not on file  Other Topics Concern   Not on file  Social History Narrative   Not on file   Social Determinants of Health   Financial Resource Strain: Not on file  Food Insecurity: Not on file  Transportation Needs: Not on file  Physical Activity: Not on file  Stress: Not on file  Social Connections: Not on file  Intimate Partner Violence: Not on file    Review of Systems: See HPI, otherwise negative ROS  Physical Exam: Vital signs in last 24 hours: Temp:  [97.6 F (36.4 C)] 97.6 F (36.4 C) (11/26 0726) Pulse Rate:  [78] 78 (11/26 0726) Resp:  [28] 28 (11/26 0726) BP: (132)/(79) 132/79 (11/26 0726) SpO2:  [98 %] 98 % (11/26 0726) Weight:  [66.7 kg] 66.7 kg (11/26 0716)   General:   Alert,  Well-developed, well-nourished, pleasant and cooperative in NAD Head:  Normocephalic and atraumatic. Eyes:  Sclera clear, no icterus.   Conjunctiva pink. Ears:  Normal auditory acuity. Nose:  No deformity, discharge,  or lesions. Msk:  Symmetrical without gross deformities. Normal posture. Extremities:  Without clubbing or edema. Neurologic:  Alert and  oriented x4;  grossly normal neurologically. Skin:  Intact  without significant lesions or rashes. Psych:  Alert and cooperative. Normal mood and affect.  Impression/Plan: [name] is here for a flexible sigmoidoscopy due to history of large sigmoid polyp  The risks of the procedure including infection, bleed, or perforation as well as benefits, limitations, alternatives and imponderables have been reviewed with the patient. Questions have been answered. All parties agreeable.

## 2023-03-17 LAB — SURGICAL PATHOLOGY

## 2023-03-20 DIAGNOSIS — J439 Emphysema, unspecified: Secondary | ICD-10-CM | POA: Diagnosis not present

## 2023-03-20 DIAGNOSIS — I10 Essential (primary) hypertension: Secondary | ICD-10-CM | POA: Diagnosis not present

## 2023-04-02 ENCOUNTER — Encounter (HOSPITAL_COMMUNITY): Payer: Self-pay | Admitting: Internal Medicine

## 2023-04-20 DIAGNOSIS — F411 Generalized anxiety disorder: Secondary | ICD-10-CM | POA: Diagnosis not present

## 2023-04-20 DIAGNOSIS — J439 Emphysema, unspecified: Secondary | ICD-10-CM | POA: Diagnosis not present

## 2023-05-20 ENCOUNTER — Encounter: Payer: Self-pay | Admitting: Internal Medicine

## 2023-06-07 ENCOUNTER — Ambulatory Visit: Payer: Medicare HMO | Attending: Cardiology | Admitting: Cardiology

## 2023-06-07 ENCOUNTER — Encounter: Payer: Self-pay | Admitting: Cardiology

## 2023-06-07 VITALS — BP 120/78 | HR 78 | Ht 71.0 in | Wt 144.8 lb

## 2023-06-07 DIAGNOSIS — I10 Essential (primary) hypertension: Secondary | ICD-10-CM

## 2023-06-07 DIAGNOSIS — Z720 Tobacco use: Secondary | ICD-10-CM

## 2023-06-07 DIAGNOSIS — R072 Precordial pain: Secondary | ICD-10-CM | POA: Diagnosis not present

## 2023-06-07 MED ORDER — METOPROLOL TARTRATE 100 MG PO TABS: 100.0000 mg | ORAL_TABLET | ORAL | 0 refills | Status: DC

## 2023-06-07 NOTE — Progress Notes (Signed)
 Cardiology Office Note:  .   Date:  06/07/2023  ID:  Joel Salazar, DOB 1970-12-27, MRN [id number] PCP: Assunta Found, MD   HeartCare Providers Cardiologist:  Donato Schultz, MD    History of Present Illness: .   Joel Salazar is a 53 y.o. male Discussed with the use of AI scribe  History of Present Illness   Joel Salazar is a 53 year old male who presents with chest pain. He was referred by Assunta Found for evaluation of chest pain.  He has been experiencing intermittent chest pain for the past two weeks, described as a tightness in the chest that sometimes accompanies shoulder pain. He has a history of right shoulder surgery, and the numbness he previously experienced has resolved. 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  He was evaluated in the emergency department on March 01, 2023, where an EKG showed sinus rhythm with early repolarization, and troponin levels were normal. A chest x-ray showed no active cardiopulmonary disease but noted somewhat elevated lung fields. His last LDL was 84 mg/dL on February 02, 2023, with a hemoglobin of 14.7 g/dL, creatinine of 0.7 mg/dL, and TSH of 4.09 mIU/L.  He smokes and finds quitting challenging, especially as he works closely with another smoker. He has a history of a collapsed lung and does not eat the healthiest diet.  He has a family history of heart disease, with a brother who suffered a massive heart attack at the age of 62.  He is currently taking olmesartan 40 mg for high blood pressure, which was initially very high and associated with severe headaches.          ROS: +CP, +SOB  Studies Reviewed: .        Results   LABS Troponins: Normal (03/01/2023) LDL: 84 (02/02/2023) Hemoglobin: 14.7 Creatinine: 0.7 TSH: 1.35  RADIOLOGY Chest x-ray: No active cardiopulmonary disease, increased lung fields (03/01/2023) Chest CT: Mild calcified plaque in  aorta and coronary arteries (06/2021)-personally viewed and interpreted and displayed for him to see.   DIAGNOSTIC EKG: Sinus rhythm with early repolarization (03/01/2023)     Risk Assessment/Calculations:            Physical Exam:   VS:  BP 120/78   Pulse 78   Ht 5\' 11"  (1.803 m)   Wt 144 lb 12.8 oz (65.7 kg)   SpO2 97%   BMI 20.20 kg/m    Wt Readings from Last 3 Encounters:  06/07/23 144 lb 12.8 oz (65.7 kg)  03/16/23 147 lb (66.7 kg)  03/01/23 150 lb (68 kg)    GEN: Well nourished, well developed in no acute distress NECK: No JVD; No carotid bruits CARDIAC: RRR, no murmurs, no rubs, no gallops RESPIRATORY:  Clear to auscultation without rales, wheezing or rhonchi  ABDOMEN: Soft, non-tender, non-distended EXTREMITIES:  No edema; No deformity   ASSESSMENT AND PLAN: .    Assessment and Plan    Chest Pain Intermittent chest pain for two weeks, associated with shoulder pain. EKG shows sinus rhythm with early repolarization. Troponins and chest x-ray normal. Differential includes musculoskeletal pain, cardiac etiology, and smoking-related issues. Family history of heart disease noted. Discussed coronary CT scan to evaluate for blockages or stenosis. Patient expressed interest in identifying any blockages, understanding the risk of heart attacks. - Order coronary CT scan to evaluate for blockages or stenosis - Administer metoprolol prior to procedure - Follow up with results of the study  Hypertension Elevated blood pressure managed with olmesartan 40 mg. Blood pressure control is crucial to prevent heart attacks and strokes. Patient reported initial high blood pressure and headaches, which improved with medication. - Continue olmesartan 40 mg daily  Smoking Cessation Current smoker with a history of collapsed lung and shortness of breath. Smoking is a significant risk factor for heart disease. Discussed challenges of quitting and benefits of cessation, including reduced risk  of heart disease and improved lung function. - Encourage smoking cessation - Discuss risks of continued smoking and benefits of quitting  General Health Maintenance Diet and lifestyle modifications discussed. Patient acknowledges poor dietary habits and the need for healthier options. Emphasized the importance of diet and exercise in managing overall health and reducing cardiovascular risk. - Recommend dietary changes to include healthier options - Encourage regular exercise  Follow-up - Follow up with results of coronary CT scan.               Signed, Donato Schultz, MD

## 2023-06-07 NOTE — Patient Instructions (Signed)
 Medication Instructions:  The current medical regimen is effective;  continue present plan and medications.  *If you need a refill on your cardiac medications before your next appointment, please call your pharmacy*  Testing/Procedures:   Your cardiac CT will be scheduled at:   [facility] [address] [place], [place] [address] [phone]  Please arrive at the Entrance C - got to elevators to Radiology department on the first floor.  Please follow these instructions carefully (unless otherwise directed):  An IV will be required for this test and Nitroglycerin will be given.  Hold all erectile dysfunction medications at least 3 days (72 hrs) prior to test. (Ie viagra, cialis, sildenafil, tadalafil, etc)   On the Night Before the Test: Be sure to Drink plenty of water. Do not consume any caffeinated/decaffeinated beverages or chocolate 12 hours prior to your test. Do not take any antihistamines 12 hours prior to your test.  On the Day of the Test: Drink plenty of water until 1 hour prior to the test. Do not eat any food 1 hour prior to test. You may take your regular medications prior to the test.  Take metoprolol (Lopressor) two hours prior to test. If you take Furosemide/Hydrochlorothiazide/Spironolactone/Chlorthalidone, please HOLD on the morning of the test. Patients who wear a continuous glucose monitor MUST remove the device prior to scanning.      After the Test: Drink plenty of water. After receiving IV contrast, you may experience a mild flushed feeling. This is normal. On occasion, you may experience a mild rash up to 24 hours after the test. This is not dangerous. If this occurs, you can take Benadryl 25 mg, Zyrtec, Claritin, or Allegra and increase your fluid intake. (Patients taking Tikosyn should avoid Benadryl, and may take Zyrtec, Claritin, or Allegra) If you experience trouble breathing, this can be serious. If it is severe call 911  IMMEDIATELY. If it is mild, please call our office.  We will call to schedule your test 2-4 weeks out understanding that some insurance companies will need an authorization prior to the service being performed.   For more information and frequently asked questions, please visit our website : [email or web address]  For non-scheduling related questions, please contact the cardiac imaging nurse navigator should you have any questions/concerns: Cardiac Imaging Nurse Navigators Direct Office Dial: [phone]   For scheduling needs, including cancellations and rescheduling, please call [place], [phone].  Follow-Up: At [facility], you and your health needs are our priority.  As part of our continuing mission to provide you with exceptional heart care, we have created designated Provider Care Teams.  These Care Teams include your primary Cardiologist (physician) and Advanced Practice Providers (APPs -  Physician Assistants and Nurse Practitioners) who all work together to provide you with the care you need, when you need it.  We recommend signing up for the patient portal called "MyChart".  Sign up information is provided on this After Visit Summary.  MyChart is used to connect with patients for Virtual Visits (Telemedicine).  Patients are able to view lab/test results, encounter notes, upcoming appointments, etc.  Non-urgent messages can be sent to your provider as well.   To learn more about what you can do with MyChart, go to [email or web address].    Your next appointment:   Follow up will be based on the results of the above testing.    1st Floor: - Lobby - Registration  - Pharmacy  - Lab - Cafe  2nd Floor: - PV  Lab - Diagnostic Testing (echo, CT, nuclear med)  3rd Floor: - Vacant  4th Floor: - TCTS (cardiothoracic surgery) - AFib Clinic - Structural Heart Clinic - Vascular Surgery  - Vascular Ultrasound  5th Floor: - HeartCare Cardiology  (general and EP) - Clinical Pharmacy for coumadin, hypertension, lipid, weight-loss medications, and med management appointments    Valet parking services will be available as well.

## 2023-06-22 ENCOUNTER — Telehealth (HOSPITAL_COMMUNITY): Payer: Self-pay | Admitting: *Deleted

## 2023-06-22 NOTE — Telephone Encounter (Signed)
 Attempted to call patient regarding upcoming cardiac CT appointment. Left message on voicemail with name and callback number  Larey Brick RN Navigator Cardiac Imaging [facility] [phone] Office [phone] Cell

## 2023-06-23 ENCOUNTER — Ambulatory Visit (HOSPITAL_COMMUNITY)
Admission: RE | Admit: 2023-06-23 | Discharge: 2023-06-23 | Disposition: A | Discharge status: Home / Self Care | Payer: Medicare HMO | Source: Ambulatory Visit | Attending: Cardiology | Admitting: Cardiology

## 2023-06-23 DIAGNOSIS — R072 Precordial pain: Secondary | ICD-10-CM | POA: Insufficient documentation

## 2023-06-23 MED ORDER — NITROGLYCERIN 0.4 MG SL SUBL
0.8000 mg | SUBLINGUAL_TABLET | Freq: Once | SUBLINGUAL | Status: AC
  Administered 2023-06-23: 0.8 mg via SUBLINGUAL

## 2023-06-23 MED ORDER — DILTIAZEM HCL 25 MG/5ML IV SOLN: 10.0000 mg | INTRAVENOUS | Status: DC | PRN

## 2023-06-23 MED ORDER — IOHEXOL 350 MG/ML SOLN
95.0000 mL | Freq: Once | INTRAVENOUS | Status: AC | PRN
  Administered 2023-06-23: 95 mL via INTRAVENOUS

## 2023-06-23 MED ORDER — METOPROLOL TARTRATE 5 MG/5ML IV SOLN: 10.0000 mg | Freq: Once | INTRAVENOUS | Status: DC | PRN

## 2023-06-23 MED ORDER — NITROGLYCERIN 0.4 MG SL SUBL
SUBLINGUAL_TABLET | SUBLINGUAL | Status: AC
  Filled 2023-06-23: qty 2

## 2023-06-24 ENCOUNTER — Encounter: Payer: Self-pay | Admitting: Cardiology

## 2023-06-25 ENCOUNTER — Other Ambulatory Visit (HOSPITAL_COMMUNITY): Payer: Self-pay | Admitting: *Deleted

## 2023-06-25 DIAGNOSIS — R931 Abnormal findings on diagnostic imaging of heart and coronary circulation: Secondary | ICD-10-CM

## 2023-06-25 DIAGNOSIS — Z79899 Other long term (current) drug therapy: Secondary | ICD-10-CM

## 2023-06-25 MED ORDER — ASPIRIN 81 MG PO TBEC: 81.0000 mg | DELAYED_RELEASE_TABLET | Freq: Every day | ORAL | Status: AC

## 2023-06-25 MED ORDER — ROSUVASTATIN CALCIUM 10 MG PO TABS: 10.0000 mg | ORAL_TABLET | Freq: Every day | ORAL | 3 refills | Status: AC

## 2023-08-18 ENCOUNTER — Ambulatory Visit (INDEPENDENT_AMBULATORY_CARE_PROVIDER_SITE_OTHER): Admitting: Internal Medicine

## 2023-08-18 ENCOUNTER — Encounter: Payer: Self-pay | Admitting: Internal Medicine

## 2023-08-18 ENCOUNTER — Other Ambulatory Visit: Payer: Self-pay | Admitting: *Deleted

## 2023-08-18 VITALS — BP 135/80 | HR 94 | Temp 97.3°F | Ht 71.0 in | Wt 145.8 lb

## 2023-08-18 DIAGNOSIS — J439 Emphysema, unspecified: Secondary | ICD-10-CM | POA: Diagnosis not present

## 2023-08-18 DIAGNOSIS — A630 Anogenital (venereal) warts: Secondary | ICD-10-CM

## 2023-08-18 DIAGNOSIS — K6289 Other specified diseases of anus and rectum: Secondary | ICD-10-CM

## 2023-08-18 DIAGNOSIS — L290 Pruritus ani: Secondary | ICD-10-CM

## 2023-08-18 DIAGNOSIS — I10 Essential (primary) hypertension: Secondary | ICD-10-CM | POA: Diagnosis not present

## 2023-08-18 DIAGNOSIS — F17200 Nicotine dependence, unspecified, uncomplicated: Secondary | ICD-10-CM | POA: Diagnosis not present

## 2023-08-18 NOTE — Progress Notes (Signed)
 Referring Provider: Minus Amel, MD Primary Care Physician:  Minus Amel, MD Primary GI:  Dr. Mordechai April  Chief Complaint  Patient presents with   Follow-up    Patient here today due to having issues with hemorrhoids. He says they are some what better today, but a couple of weeks ago they were painful, itching and burning. He has been using prep H, this has helped some. Patient says he had seen some bright red blood awhile ago, but noting recent. He has not seen any dark stools. He is having a change in the frequency of having a bm as of late, he says he does not think constipated.     HPI:   Joel Salazar is a 53 y.o. male who presents to the clinic today for an acute visit.  States for the last couple weeks he has had rectal irritation and itching.  Also notes discomfort in the area, which is mild intermittent.  No rectal bleeding.  Denies any chronic issues with constipation.  Past Medical History:  Diagnosis Date   Arthritis    Hypertension    Shortness of breath dyspnea    due to rib fractures    Past Surgical History:  Procedure Laterality Date   ACETABULUM FRACTURE SURGERY Left    APPENDECTOMY     BACK SURGERY     Disectomy   COLONOSCOPY WITH PROPOFOL  N/A 01/05/2023   Procedure: COLONOSCOPY WITH PROPOFOL ;  Surgeon: Vinetta Greening, DO;  [facility];  Service: Endoscopy;  Laterality: N/A;  11:00 AM, ASA 2   FEMUR SURGERY Right    rod placed   FLEXIBLE SIGMOIDOSCOPY N/A 03/16/2023   Procedure: FLEXIBLE SIGMOIDOSCOPY;  Surgeon: Vinetta Greening, DO;  [facility];  Service: Endoscopy;  Laterality: N/A;  815am,a sa 2   ORIF CLAVICULAR FRACTURE Right 01/14/2016   Procedure: OPEN REDUCTION INTERNAL FIXATION (ORIF) CLAVICULAR FRACTURE;  Surgeon: Sammye Cristal, MD;  Location: [facility];  Service: Orthopedics;  Laterality: Right;   POLYPECTOMY  01/05/2023   Procedure: POLYPECTOMY;  Surgeon: Vinetta Greening, DO;  [facility];  Service:  Endoscopy;;  hot and cold snare   POLYPECTOMY  03/16/2023   Procedure: POLYPECTOMY INTESTINAL;  Surgeon: Vinetta Greening, DO;  [facility];  Service: Endoscopy;;   SUBMUCOSAL TATTOO INJECTION  01/05/2023   Procedure: SUBMUCOSAL TATTOO INJECTION;  Surgeon: Vinetta Greening, DO;  [facility];  Service: Endoscopy;;    Current Outpatient Medications  Medication Sig Dispense Refill   aspirin  EC 81 MG tablet Take 1 tablet (81 mg total) by mouth daily. Swallow whole.     cloNIDine  (CATAPRES ) 0.1 MG tablet Take 1 tablet (0.1 mg total) by mouth 2 (two) times daily. 60 tablet 11   rosuvastatin  (CRESTOR ) 10 MG tablet Take 1 tablet (10 mg total) by mouth daily. 90 tablet 3   tiZANidine  (ZANAFLEX ) 4 MG tablet Take 1 tablet (4 mg total) by mouth every 8 (eight) hours as needed for muscle spasms. 10 tablet 0   olmesartan (BENICAR) 40 MG tablet Take 40 mg by mouth daily.     No current facility-administered medications for this visit.    Allergies as of 08/18/2023   (No Known Allergies)    History reviewed. No pertinent family history.  Social History   Socioeconomic History   Marital status: Divorced    Spouse name: Not on file   Number of children: Not on file   Years of education:  Not on file   Highest education level: Not on file  Occupational History   Not on file  Tobacco Use   Smoking status: Every Day    Current packs/day: 2.00    Average packs/day: 2.0 packs/day for 22.0 years (44.0 ttl pk-yrs)    Types: Cigarettes   Smokeless tobacco: Never   Tobacco comments:    Has not smoked since 07/08/21. ARJ 07/14/21  Vaping Use   Vaping status: Never Used  Substance and Sexual Activity   Alcohol use: Yes    Comment: 6 beers/night   Drug use: Yes    Frequency: 3.0 times per week    Types: Marijuana   Sexual activity: Not on file  Other Topics Concern   Not on file  Social History Narrative   Not on file   Social Drivers of Health   Financial Resource  Strain: Not on file  Food Insecurity: Not on file  Transportation Needs: Not on file  Physical Activity: Not on file  Stress: Not on file  Social Connections: Not on file    Subjective: Review of Systems  Constitutional:  Negative for chills and fever.  HENT:  Negative for congestion and hearing loss.   Eyes:  Negative for blurred vision and double vision.  Respiratory:  Negative for cough and shortness of breath.   Cardiovascular:  Negative for chest pain and palpitations.  Gastrointestinal:  Negative for abdominal pain, blood in stool, constipation, diarrhea, heartburn, melena and vomiting.  Genitourinary:  Negative for dysuria and urgency.  Musculoskeletal:  Negative for joint pain and myalgias.  Skin:  Negative for itching and rash.  Neurological:  Negative for dizziness and headaches.  Psychiatric/Behavioral:  Negative for depression. The patient is not nervous/anxious.      Objective: BP 135/80 (BP Location: Left Arm, Patient Position: Sitting, Cuff Size: Normal)   Pulse 94   Temp (!) 97.3 F (36.3 C) (Temporal)   Ht 5\' 11"  (1.803 m)   Wt 145 lb 12.8 oz (66.1 kg)   BMI 20.33 kg/m  Physical Exam Constitutional:      Appearance: Normal appearance.  HENT:     Head: Normocephalic and atraumatic.  Eyes:     Extraocular Movements: Extraocular movements intact.     Conjunctiva/sclera: Conjunctivae normal.  Cardiovascular:     Rate and Rhythm: Normal rate and regular rhythm.  Pulmonary:     Effort: Pulmonary effort is normal.     Breath sounds: Normal breath sounds.  Abdominal:     General: Bowel sounds are normal.     Palpations: Abdomen is soft.  Musculoskeletal:        General: Normal range of motion.     Cervical back: Normal range of motion and neck supple.  Skin:    General: Skin is warm.  Neurological:     General: No focal deficit present.     Mental Status: He is alert and oriented to person, place, and time.  Psychiatric:        Mood and Affect: Mood  normal.        Behavior: Behavior normal.   Rectal exam: Multiple small raised spots, few clusters consistent with condyloma acuminatum.  I do not see any anal fissures or hemorrhoids.  No perianal excoriation.   Assessment: *Condyloma acuminatum *Rectal itching *Rectal pain  Plan: Patient's rectal exam appears to be condyloma acuminatum.  Will refer to colorectal surgery for further evaluation and treatment.  Appreciate their help immensely.    08/18/2023 11:14 AM  Disclaimer: This note was dictated with voice recognition software. Similar sounding words can inadvertently be transcribed and may not be corrected upon review.

## 2023-08-18 NOTE — Patient Instructions (Signed)
 I am going to refer you to colorectal surgery to further evaluate your anal warts.  It was very nice seeing you again today.  Dr. Mordechai April

## 2023-08-18 NOTE — Addendum Note (Signed)
 Addended by: Alvester Johnson on: 08/18/2023 01:21 PM   Modules accepted: Orders

## 2023-10-18 DIAGNOSIS — J439 Emphysema, unspecified: Secondary | ICD-10-CM | POA: Diagnosis not present

## 2023-10-18 DIAGNOSIS — I10 Essential (primary) hypertension: Secondary | ICD-10-CM | POA: Diagnosis not present

## 2023-11-15 DIAGNOSIS — L700 Acne vulgaris: Secondary | ICD-10-CM | POA: Diagnosis not present

## 2024-03-05 IMAGING — DX DG CHEST 2V
2 series · 2 of 2 positions shown · non-contrast
Comparison: 07/18/2021

CLINICAL DATA: 50-year-old male with follow-up of pneumothorax

EXAM:
CHEST - 2 VIEW

[chest pa]
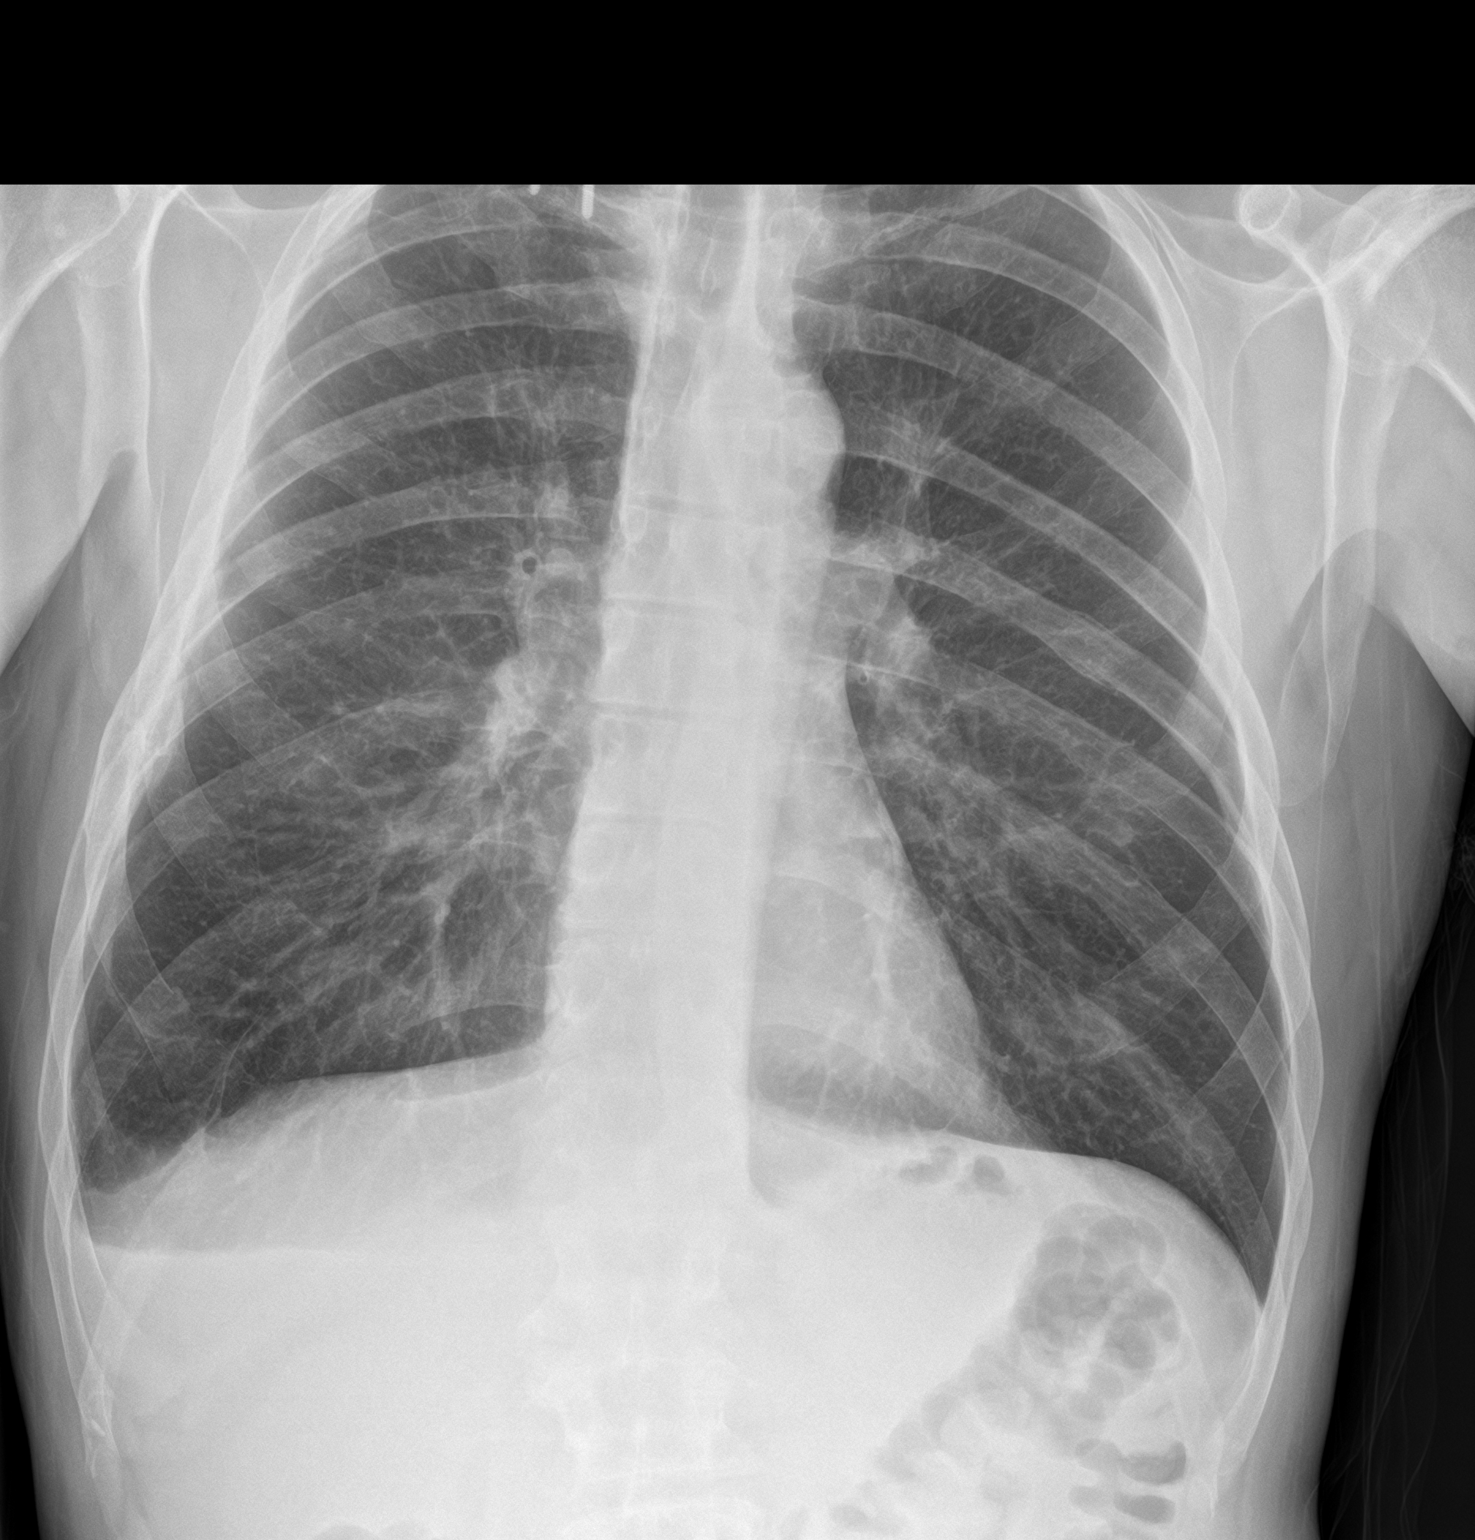

[chest lat]
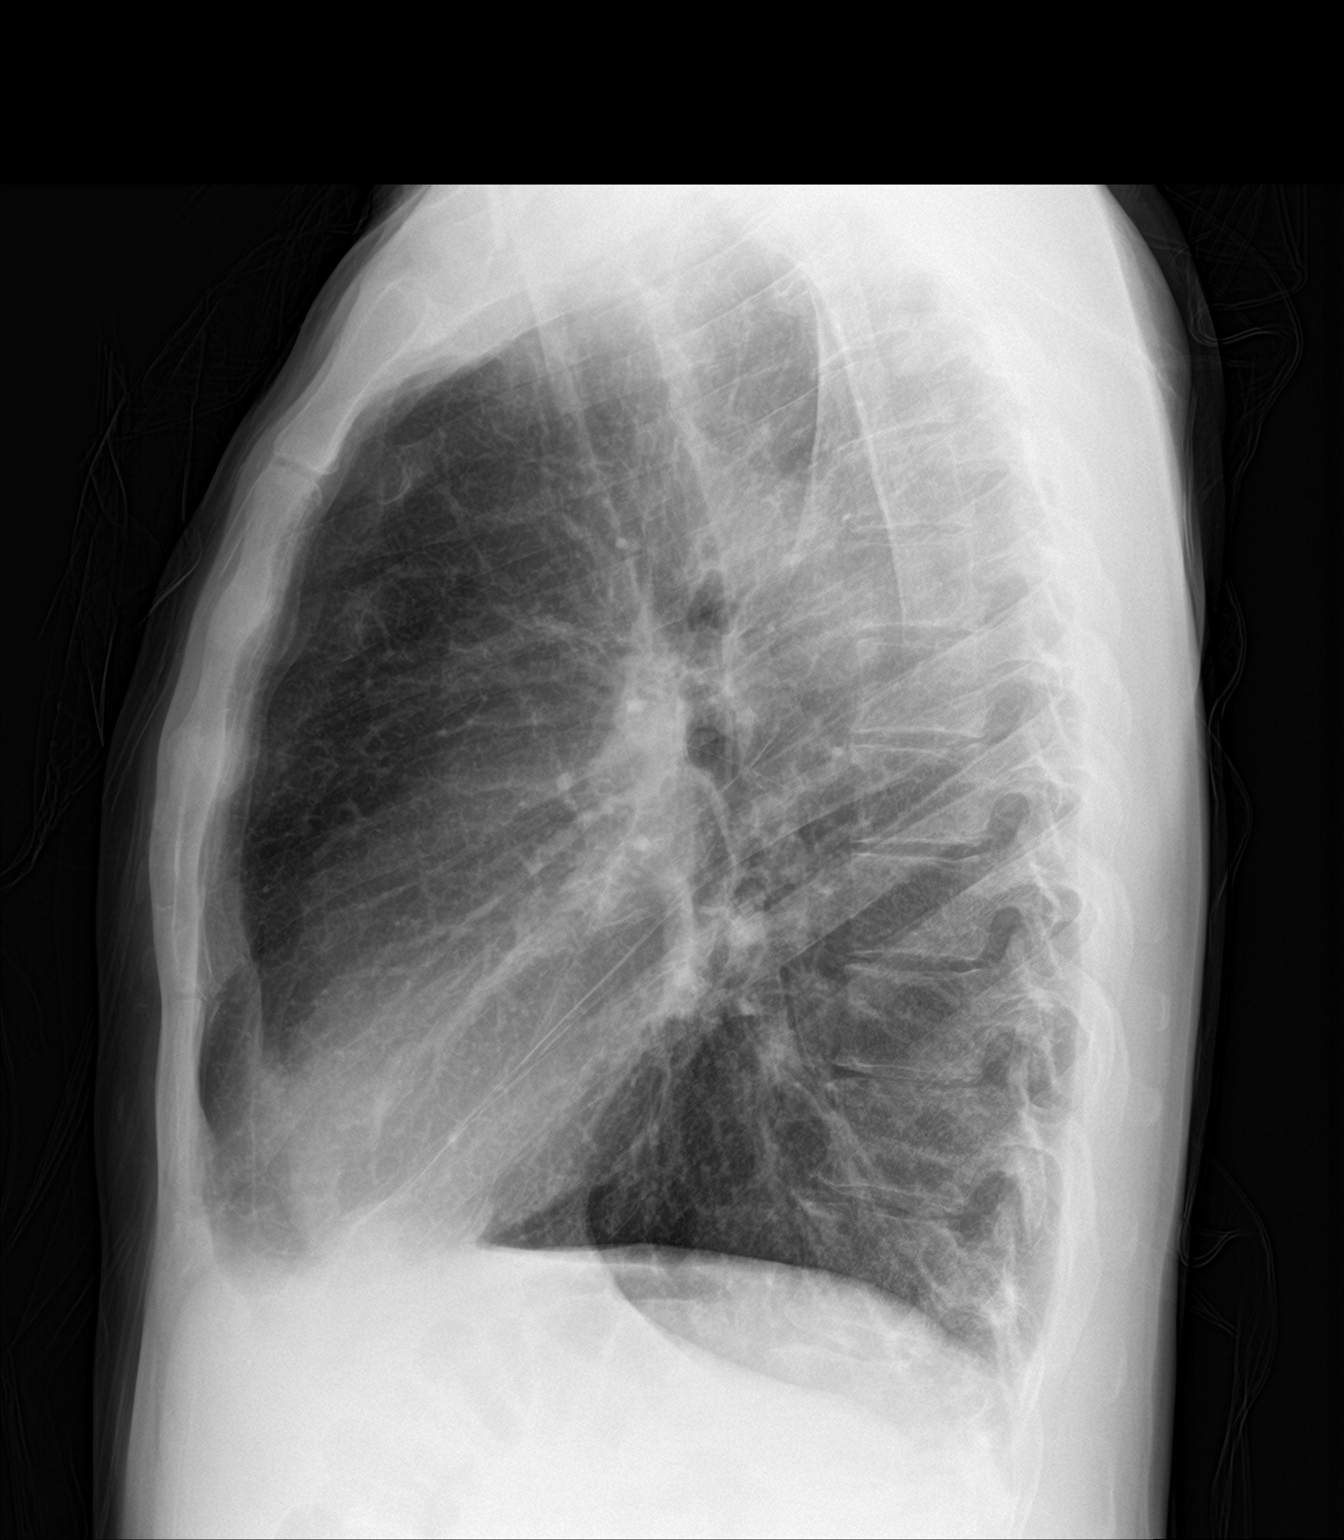

[2 of 2 positions shown; findings below may reference images not displayed]

FINDINGS: Cardiomediastinal silhouette unchanged in size and contour. No
evidence of central vascular congestion. No interlobular septal
thickening. Mild architectural distortion extending from the
bilateral hila.

Scarring at the bilateral apices with bullous change. No visualized
pneumothorax. Meniscus at the right lung base persists. No new
confluent airspace disease.

Chronic deformity of right rib fracture, incompletely healed.
Surgical changes of the right clavicle.
IMPRESSION: No visualized pneumothorax with bullous change/scarring at the
bilateral lung apices.

Residual right pleural effusion.

Emphysema.

## 2024-03-27 DIAGNOSIS — B0052 Herpesviral keratitis: Secondary | ICD-10-CM | POA: Diagnosis not present

## 2024-03-27 DIAGNOSIS — H2513 Age-related nuclear cataract, bilateral: Secondary | ICD-10-CM | POA: Diagnosis not present

## 2024-05-04 ENCOUNTER — Telehealth (INDEPENDENT_AMBULATORY_CARE_PROVIDER_SITE_OTHER): Payer: Self-pay

## 2024-05-04 NOTE — Telephone Encounter (Signed)
 Patient called today stating he is having issues with rectal burning and some rectal bleeding with wiping. He does not see the blood all the time, just periodically.   Patient last seen Dr. Cindie on 08/18/2023 and was thought to have Condyloma acuminatum and was sent to a general surgeon whom saw the patient on 11/15/2023 and states the patient diagnoses was Perianal lesions consistent with comedone.Given patient's irritation has resolved, there is no further treatment that is needed at this time.  Return if symptoms worsen or fail to improve.  Alicia C Umbach, MD Colon and Rectal Surgery [facility]   Patient was transferred to the front office for an appointment, and given the next availability with Sonny Kerns, PAC on 05/08/2024 at 11:30 am.

## 2024-05-04 NOTE — Telephone Encounter (Signed)
 Noted, Thanks

## 2024-05-04 NOTE — Telephone Encounter (Signed)
 I can see him but if his symptoms are due to external perianal lesions for which Dr. Cindie sent him to see the surgeon, I would refer him back to surgeon.

## 2024-05-08 ENCOUNTER — Ambulatory Visit: Admitting: Gastroenterology

## 2024-05-08 ENCOUNTER — Encounter: Payer: Self-pay | Admitting: Gastroenterology

## 2024-05-08 ENCOUNTER — Telehealth: Payer: Self-pay

## 2024-05-08 VITALS — BP 130/79 | HR 87 | Temp 97.7°F | Ht 71.0 in | Wt 139.0 lb

## 2024-05-08 DIAGNOSIS — K6289 Other specified diseases of anus and rectum: Secondary | ICD-10-CM | POA: Insufficient documentation

## 2024-05-08 DIAGNOSIS — R208 Other disturbances of skin sensation: Secondary | ICD-10-CM | POA: Insufficient documentation

## 2024-05-08 DIAGNOSIS — K625 Hemorrhage of anus and rectum: Secondary | ICD-10-CM | POA: Diagnosis not present

## 2024-05-08 MED ORDER — NITROGLYCERIN 0.4 % RE OINT: TOPICAL_OINTMENT | RECTAL | 0 refills | Status: AC

## 2024-05-08 NOTE — Telephone Encounter (Signed)
 Please let him know I called in compounded nitroglycerin  0.25%/hemorrhoid cream with lidocaine  to apply three times daily for 3 weeks. It will be made at [employer]. He can reach out to them, they may call him regarding price etc.   He WILL NOT need to add any over the counter hemorrhoid cream for pain as it will be in the compounded formula.

## 2024-05-08 NOTE — Progress Notes (Signed)
 "    GI Office Note    Referring Provider: Marvine Rush, MD Primary Care Physician:  Marvine Rush, MD  Primary Gastroenterologist: Carlin POUR. Cindie, DO   Chief Complaint   Chief Complaint  Patient presents with   Rectal Pain    Having issues with rectal burning and itching    History of Present Illness   Joel Salazar is a 54 y.o. male presenting today for semi-urgent visit after calling last week with complaints of rectal pain/burning.  Patient last seen 07/2023 with similar symptoms. On rectal exam at that time he had multiple small raised spots, few clusters suspected condyloma acuminatum, no anal fissures or hemorrhoids seen. No perianal excoriation at that time. Internal exam was not completed.   Patient was referred to general surgeon, Dr. Bernarda Debby. Seen 10/2023. Her assessment was multiple perianal comedones present, no condyloma noted. Patient states his symptoms were much improved when he saw Dr. Debby.   Discussed the use of AI scribe software for clinical note transcription with the patient, who gave verbal consent to proceed.  History of Present Illness   He presents today with recurrent severe anal pain. He reports he never had these symptoms until around one month after his sigmoidoscopy in 2024. Has had intermittent symptoms but current flare will not go away. Over the past three weeks the pain has become constant and severe, described as burning and on fire, sometimes bringing him to tears. Pain is present at rest and worsens with and after bowel movements, and was particularly intense over the past weekend. Over-the-counter topical preparations, showers, and other topical applications have provided minimal relief. He feels a painful lump in the anal area, especially with wiping. He denies rectal penetration or trauma.  He denies constipation. He is having at least one soft stool each morning, no straining or hard stools. Notes his stools used to be a lot  easier and more productive before his colonoscopy.   Over the past week he has had episodes of bright red blood on tissue and in the stool, with the heaviest bleeding mid last week. He denies abdominal pain, fever, or weight loss.  He has had no recent changes in chronic medications or dosages.     Prior Data     Results   Flex sig 03/16/2023: -nonbleeding internal hemorrhoids -one 25mm polyp in sigmoid colon removed, benign inflammatory polyp showing features of mucosal prolapse, 2 cm -tattoo seen in sigmoid colon, tattoo site appeared normal - Colonoscopy in 3 years  Colonoscopy 12/2022: -nonbleeding internal hermorrhoids -two 4-75mm polyps in cecum removed, tubular adenomas -one 15mm polyp sigmoid colon, removed -one 20-6mm polyp in sigmoid colon tattooed, could not be removed due to location/angulated colon -one 8mm polyp sigmoid colon removed, hyperplastic polyp with extensive prolapse type changes - Sigmoid polypectomy with hyperplastic/serrated change, smooth muscle hyperplasia, extensively dilated crypts with pseudoinvasion in background hemosiderin laden macrophages, 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 Swallow whole.     cloNIDine  (CATAPRES ) 0.1 MG tablet Take 1 tablet (0.1 mg total) by mouth 2 (two) times daily. 60 tablet 11   olmesartan (BENICAR) 40 MG tablet Take 40 mg by mouth daily.     rosuvastatin  (CRESTOR ) 10 MG tablet Take 1 tablet (10 mg total) by mouth daily. 90 tablet 3  tiZANidine  (ZANAFLEX ) 4 MG tablet Take 1 tablet (4 mg total) by mouth every 8 (eight) hours as needed for muscle spasms. 10 tablet 0   valACYclovir  (VALTREX ) 500 MG tablet Take 500 mg by mouth daily.     No current facility-administered medications for this visit.     Allergies   Allergies as of 05/08/2024   (No Known Allergies)     Past Medical History   Past Medical History:  Diagnosis Date   Arthritis    Hypertension    Shortness of breath dyspnea    due to rib fractures    Past Surgical History   Past Surgical History:  Procedure Laterality Date   ACETABULUM FRACTURE SURGERY Left    APPENDECTOMY     BACK SURGERY     Disectomy   COLONOSCOPY WITH PROPOFOL  N/A 01/05/2023   Procedure: COLONOSCOPY WITH PROPOFOL ;  Surgeon: Cindie Carlin POUR, DO;  [facility];  Service: Endoscopy;  Laterality: N/A;  11:00 AM, ASA 2   FEMUR SURGERY Right    rod placed   FLEXIBLE SIGMOIDOSCOPY N/A 03/16/2023   Procedure: FLEXIBLE SIGMOIDOSCOPY;  Surgeon: Cindie Carlin POUR, DO;  [facility];  Service: Endoscopy;  Laterality: N/A;  815am,a sa 2   ORIF CLAVICULAR FRACTURE Right 01/14/2016   Procedure: OPEN REDUCTION INTERNAL FIXATION (ORIF) CLAVICULAR FRACTURE;  Surgeon: Eva Herring, MD;  Location: [facility];  Service: Orthopedics;  Laterality: Right;   POLYPECTOMY  01/05/2023   Procedure: POLYPECTOMY;  Surgeon: Cindie Carlin POUR, DO;  [facility];  Service: Endoscopy;;  hot and cold snare   POLYPECTOMY  03/16/2023   Procedure: POLYPECTOMY INTESTINAL;  Surgeon: Cindie Carlin POUR, DO;  [facility];  Service: Endoscopy;;   SUBMUCOSAL TATTOO INJECTION  01/05/2023   Procedure: SUBMUCOSAL TATTOO INJECTION;  Surgeon: Cindie Carlin POUR, DO;  [facility];  Service: Endoscopy;;    Past Family History   History reviewed. No pertinent family history.  Past Social History   Social History   Socioeconomic History   Marital status: Divorced    Spouse name: Not on file   Number of children: Not on file   Years of education: Not on file   Highest education level: Not on file  Occupational History   Not on file  Tobacco Use   Smoking status: Every Day    Current packs/day: 2.00    Average packs/day:  2.0 packs/day for 22.0 years (44.0 ttl pk-yrs)    Types: Cigarettes   Smokeless tobacco: Never   Tobacco comments:    Has not smoked since 07/08/21. ARJ 07/14/21  Vaping Use   Vaping status: Never Used  Substance and Sexual Activity   Alcohol use: Yes    Comment: 6 beers/night   Drug use: Yes    Frequency: 3.0 times per week    Types: Marijuana   Sexual activity: Not on file  Other Topics Concern   Not on file  Social History Narrative   Not on file   Social Drivers of Health   Tobacco Use: High Risk (05/08/2024)   Patient History    Smoking Tobacco Use: Every Day    Smokeless Tobacco Use: Never    Passive Exposure: Not on file  Financial Resource Strain: Not on file  Food Insecurity: Not on file  Transportation Needs: Not on file  Physical Activity: Not on file  Stress: Not on file  Social Connections: Not on file  Intimate Partner Violence: Not on file  Depression (PHQ2-9): Not on file  Alcohol Screen: Not on file  Housing: Unknown (11/15/2023)   Received from [facility]   Epic    Unable to Pay for Housing in the Last Year: Not on file    Number of Times Moved in the Last Year: Not on file    At any time in the past 12 months, were you homeless or living in a shelter (including now)?: No  Utilities: Not on file  Health Literacy: Not on file    Review of Systems   General: Negative for anorexia, weight loss, fever, chills, fatigue, weakness. ENT: Negative for hoarseness, difficulty swallowing , nasal congestion. CV: Negative for chest pain, angina, palpitations, dyspnea on exertion, peripheral edema.  Respiratory: Negative for dyspnea at rest, dyspnea on exertion, cough, sputum, wheezing.  GI: See history of present illness. GU:  Negative for dysuria, hematuria, urinary incontinence, urinary frequency, nocturnal urination.  Endo: Negative for unusual weight change.     Physical Exam   BP 130/79   Pulse 87   Temp 97.7 F (36.5 C) (Oral)   Ht  5' 11 (1.803 m)   Wt 139 lb (63 kg)   SpO2 96%   BMI 19.39 kg/m    General: Well-nourished, well-developed in no acute distress.  Eyes: No icterus. Mouth: Oropharyngeal mucosa moist and pink   Rectal:  perianal exam with several lesions (some with blackhead appearance), clustered posteriorly with question of prolapsed hemorrhoid at same site. No thrombosed hemorrhoids seen. No apparent abscess. Fairly nontender with palpation of perianal area. Two attempts for digital internal exam not tolerated by the patient due to pain with notable sphincter spasm Extremities: No lower extremity edema. No clubbing or deformities. Neuro: Alert and oriented x 4   Skin: Warm and dry, no jaundice.   Psych: Alert and cooperative, normal mood and affect.  Labs   None  Imaging Studies   No results found.  Assessment/Plan:    Assessment & Plan Rectal pain/rectal burning/rectal bleeding suspect anal fissure Persistent rectal pain for 3 weeks, worse with BM. Exam as noted, internal exam could not be completed. Suspect anorectal fissures, hemorrhoids. Discussed possibility of perianal abscess. If he does not have some relief in the next 48 hours, would pursue imaging.  -Rectiv  apply anorectally BID for 3 weeks. Can apply rectal lidocaine  (OTC) at the same time. -sitz baths as needed for relief -if no improvement in 48 hours, would consider CT pelvis.        Sonny RAMAN. Ezzard, MHS, PA-C [facility]  "

## 2024-05-08 NOTE — Telephone Encounter (Signed)
 Pt called stating that the nitroglycerin  ointment is going to cost $300 with his insurance and his is unable to pay that.

## 2024-05-08 NOTE — Patient Instructions (Signed)
 You may have an anal fissure based on your symptoms but I could not complete rectal exam today due to your pain.  We are going to treat you with Rectiv  nitroglycerin  ointment, you will apply a thin strip about one inch long, on a gloved finger and insert inside anal area twice daily for 3 weeks. You can also use a small amount of rectal lidocaine  (over the counter for hemorrhoids) to numb the area per package label instructions.   If you do not get relief in the next few days, please let me know as we may consider imaging of the rectal area to rule out abscess.

## 2024-05-09 NOTE — Telephone Encounter (Signed)
 Pt was made aware and verbalized understanding.

## 2024-05-12 ENCOUNTER — Other Ambulatory Visit: Payer: Self-pay | Admitting: *Deleted

## 2024-05-12 ENCOUNTER — Telehealth: Payer: Self-pay

## 2024-05-12 DIAGNOSIS — K6289 Other specified diseases of anus and rectum: Secondary | ICD-10-CM

## 2024-05-12 NOTE — Telephone Encounter (Signed)
 Please arrange for CT pelvis with contrast for severe rectal pain, rule out abscess. CAN THIS BE DONE TODAY?  Tell patient to continue the current cream.  He should take two warm baths each day (sit in the tub) to provide relief and increase blood flow to his rectum.   Make a follow up appt with Dr. Bernarda Ned for rectal pain. If CT is negative for abscess, then suspect his pain is due to anal fissure and he is not getting relief with cream.

## 2024-05-12 NOTE — Telephone Encounter (Signed)
 [employer] PA for CT: CPT Code: 27806 Description: CT PELVIS W/ CONTRAST Case Number: [id number] Review Date: 05/12/2024 11:53:42 AM Expiration Date: N/A Status: Your case has been sent to clinical review. You will be notified via fax within 2 business days if additional clinical information is needed. If you wish to speak with [employer] at anytime, please call [phone]. The prior authorization you submitted, Case [id number], has been received

## 2024-05-12 NOTE — Telephone Encounter (Signed)
 Pt called stating that the medication he was prescribed is not helping and is wanting to know what else can be done.

## 2024-05-12 NOTE — Telephone Encounter (Signed)
 Pt was informed that PA had to be done for CT scan and can't schedule until get approval. Informed pt  if he gets worse he should go to ED, worse pain, fever, etc. Pt verbalized understanding.

## 2024-05-12 NOTE — Telephone Encounter (Signed)
 Mandy please refer pt back to Dr Bernarda Ned per Sonny Kerns   Phoned and advised the pt of his instructions to medications / CT if approved before leaving today / also advised the pt to follow the instructions for the creams and the baths to help with relief. Advised the pt if he gets worse and there is no relief proceed to ED over the weekend. The pt expressed understanding.

## 2024-05-16 NOTE — Telephone Encounter (Signed)
 [employer] PA for CT: Approved, authorization # [id number] Start Date: 05/12/2024 Expiration Date: 11/11/2024

## 2024-05-16 NOTE — Telephone Encounter (Signed)
 Pt informed that CT is scheduled for Wednesday, 05/17/24, arrive at 12:15 pm to check in at [facility] radiology.

## 2024-05-17 ENCOUNTER — Ambulatory Visit: Payer: Self-pay | Admitting: Gastroenterology

## 2024-05-17 ENCOUNTER — Ambulatory Visit (HOSPITAL_COMMUNITY)
Admission: RE | Admit: 2024-05-17 | Discharge: 2024-05-17 | Disposition: A | Discharge status: Home / Self Care | Source: Ambulatory Visit | Attending: Gastroenterology | Admitting: Gastroenterology

## 2024-05-17 DIAGNOSIS — K6289 Other specified diseases of anus and rectum: Secondary | ICD-10-CM | POA: Insufficient documentation

## 2024-05-17 MED ORDER — IOHEXOL 300 MG/ML  SOLN
100.0000 mL | Freq: Once | INTRAMUSCULAR | Status: AC | PRN
  Administered 2024-05-17: 100 mL via INTRAVENOUS

## 2024-05-17 MED ORDER — IOHEXOL 9 MG/ML PO SOLN
500.0000 mL | ORAL | Status: AC
  Administered 2024-05-17: 500 mL via ORAL

## 2024-05-18 NOTE — Telephone Encounter (Signed)
 Left a message for patient to contact Dr. Bernarda Ned for a follow up appt re: rectal pain.
